# Patient Record
Sex: Female | Born: 1950 | Race: White
Health system: Midwestern US, Academic
[De-identification: ages and names within clinical notes are randomized; demographics above are authoritative.]

---

## 2015-12-27 ENCOUNTER — Encounter

## 2015-12-28 ENCOUNTER — Encounter: Attending: Gerontology

## 2016-04-17 ENCOUNTER — Ambulatory Visit: Admit: 2016-04-17 | Discharge: 2016-04-17 | Payer: PRIVATE HEALTH INSURANCE | Attending: Gerontology

## 2016-04-17 DIAGNOSIS — L219 Seborrheic dermatitis, unspecified: Secondary | ICD-10-CM

## 2016-04-17 MED ORDER — ketoconazole (NIZORAL) 2 % shampoo
2 | TOPICAL | 5 refills | 30.00000 days | Status: AC
Start: 2016-04-17 — End: 2017-04-17

## 2016-04-17 NOTE — Unmapped (Signed)
Seborrheic Dermatitis    What is seborrheic dermatitis?  Seborrheic dermatitis is a common, benign, scaling rash that commonly occurs on the scalp, eyebrows, sides of nose, and ear. Dandruff falls into this diagnosis. There is no cure for this condition, however treatments can help to keep symptoms and disease activity under control.    Treatments    Ketoconazole 2% shampoo apply to scalp and face  three times a week for 4 weeks and then once a week ongoing Leave on 5 to 10 minutes before rinsing.

## 2016-04-17 NOTE — Unmapped (Signed)
Chief Complaint: Scaly rash on cheeks  HPI: Pt is a 65 y.o. 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 No family history of MM. Here today for evaluation of the following:    1. Several month history of mildly pruritic eruption on bilateral medial cheeks and occipital scalp    2. Multiple brown lesions over trunk and extremities for several years, asymptomatic, not changing    3. Small red spots on trunk and extremities for several years, asymptomatic and unchanged      Derm History:   NPV  ( +history of NMSC or MM  ( - ) sunburns easily  ( + ) uses sunscreen  ( - ) history of tanning bed use  ( - ) family history of MM    Allergies   Allergen Reactions   ??? Peanut Oil Hives     Current Outpatient Prescriptions   Medication Sig   ??? acetaminophen Take by mouth.   ??? amlodipine-benazepril Take by mouth.   ??? aspirin-calcium carbonate Take by mouth.   ??? benazepril Take by mouth.   ??? calcium carbonate Chew by mouth.   ??? hydrOXYzine HCl TAKE 1 TABLET (ORAL) EVERY 6 HOURS AS NEEDED FOR ITCHING FOR 5 DAYS MAY CAUSE DROWSINESS   ??? levothyroxine TAKE 1 TABLET BY ORAL ROUTE EVERY DAY   ??? mometasone Use into each nostril.   ??? PARoxetine Take half tablet by mouth every evening for 10 days, then take 1 tablet by mouth every evening   ??? spironolactone Take by mouth.   ??? torsemide Take by mouth.   ??? triamcinolone APPLY 1 APPLICATION (TOPICAL) 2 TIMES PER DAY AS NEEDED   ??? ketoconazole Wash hair and affected areas on face with shampoo three times a week for 1 month then once a week thereafter. Lather and leave on for 5-10 minutes before rinsing.     No current facility-administered medications for this visit.      Active Ambulatory Problems     Diagnosis Date Noted   ??? No Active Ambulatory Problems     Resolved Ambulatory Problems     Diagnosis Date Noted   ??? No Resolved Ambulatory Problems     No Additional Past Medical History         ADDITIONAL HISTORY:  I have  reviewed past medical and surgical histories, current medications, and allergies as documented in the patient's electronic medical record    Review of Systems:   General: Negative for fevers, chills ,fatigue  Skin: No other cutaneous concerns  Allergies: Denies seasonal or environmental allergies, medication allergies reviewed    Physical Exam:  FSE  General: pleasant, in no acute distress  Skin: Examination was performed of the following: psych, scalp/hair, head/face, eyelids, lips, neck, breast/axilla/chest, abdomen, back, RUE, LUE, RLE, LLE, nails/digits and buttocks. Findings within normal limits apart from as documented below:      1.)  Bilateral medial cheeks with ill-defined scaly erythematous patches. Occipital scalp with erythematous ill-defined greasy scaly patches.    2.) Trunk and extremities with rough, verrucous stuck-on brown papules and plaques    3.) Trunk and extremities with scattered 1-4 mm bright red papules           A complete skin exam was completed on : 04/17/2016    A&P:  1.) Seborrheic Dermatitis: bilateral medial cheeks and occipital scalp  -Discussed etiology, natural course and treatment options  - reviewed  the benign but chronic nature of disease with patient   - start ketoconazole 2% shampoo three times a week for one month and then once a week ongoing, lather and leave in place for 5-10 minutes before washing off    2.) Seborrheic keratosis trunk and extremities  -educated and reassured patient regarding benign nature of lesion  -pamphlet provided to patient  - no treatment medically necessary    3.) Cherry angiomas trunk and extremities  -educated and reassured patient regarding benign nature of lesion  - no treatment medically necessary      ??  Return to clinic: one year for FSE  Discussed plan with patient and patient verbalized understanding of plan of care.  Patient to call clinic with questions or concerns  Reviewed side effects of treatment(s) and/or medication(s) with patient    AVS provided or is available on [facility] Mychart  ??

## 2016-09-26 NOTE — Unmapped (Signed)
Your patient was seen at a [facility]. Please go to [email or web address] to view information filed to your patient's chart in Epic.  If you need to view your patient's results prior to gaining access to   Epic CareLink, please contact the [facility] where your patient was seen.              Arlett, Goold #[id number] ([id number])  [phone] y.o. F)   PCP: Thamas Jaegers [phone])    OTF 1         ED Arrival Information     Expected Arrival Acuity Means of Arrival Escorted By Service Admission Type    - 09/26/2016 11:34 AM 3-Urgent Walk In - Emergency Medicine Emergency      Arrival Complaint    POSS PANIC ATTACKS        Chief Complaint     Complaint Comment    Shortness of Breath Pt reports feeling SOB and weird for the past 3 days. Pt reports nausea, denies CP. WOB is labored with exertion. Pt denies fevers or any recent travel        ED Vitals    Date/Time Temp Pulse Resp BP SpO2 Weight Who   09/26/16 1400 -- 78 16 115/67 99 % -- EJW   09/26/16 1330 -- 77 14 (!)  124/58 95 % -- EJW   09/26/16 1153 99.8 ????F (37.7 ????C) 88 26 106/89 95 % 250 lb (113.4 kg) JH        Allergies (verified on: 09/26/16)     Agent Severity Comments    Peanut Oil      Peanut-containing Drug Products          Medical History     Past Medical History Date Comments    Hypertension [I10]      Hyperlipidemia [E78.5]      Encephalitis [G04.90]      Diabetes mellitus (HCC) [E11.9]  borderline/on no meds    Thyroid disease [E07.9]  hypothyrodisim    CHF (congestive heart failure) (HCC) [I50.9]      Chronic back pain [M54.9, G89.29]      Chronic kidney disease [N18.9]      Hypothyroidism [E03.9]      Obesity [E66.9]          Surgical History     Past Surgical History     Laterality Last Occurrence Comments  Carpal tunnel release [SHX101]   bialteral/multiple times  Arm Surgery [SHX1003A]   to repair pinched nerves left arm  Colonoscopy [EXB284]  2014 normal - repeat in 10 yrs  Skin cancer    excision [XLK440]   Mole removed from upper back          Obstetric History     Gravida  4   Para  4   Living  4   Live Births  4          ED Provider Notes     Caesar Bookman, MD 09/26/2016  7:40 PM             [facility] EMERGENCY DEPARTMENT ENCOUNTER        EM RESIDENT NOTE       Date of evaluation: 09/26/2016    Chief Complaint     Shortness of Breath (Pt reports feeling SOB and weird for the past 3 days. Pt reports nausea, denies CP. WOB is labored with exertion. Pt denies fevers or any recent travel)      History of  Present Illness     JERALYNN VAQUERA is a 66 y.o. female with past medical history significant for CHF, diabetes, CK D, hyperlipidemia, hypertension who presents emergency department with episodes of lightheadedness.    Patient states for the last couple of weeks she's been having episodes where she feels lightheaded.  She denies any vertiginous symptoms.  She denies any True syncope.  She states she'll have episodes last 5-10 seconds where she feels lightheaded and   that she is breathing fast.    She denies any chest pain with the episodes patient denies any nausea, vomiting, diaphoresis.  She denies any cough with sputum production.  She denies any belly pain.  She denies any significant palpitations.  She has any headache, changes in vision,   numbness, tingling, weakness.    Patient states she's been diagnosed with panic and this in the past.  She states she was previously on medication for this which she stopped taking one month ago because it gave her joint pains and other side effects.  He also states she's been on   thyroid medications with no changes in the doses, however she has been taking it differently she says.    Review of Systems     Review of Systems   Constitutional: Negative for activity change, appetite change, chills, diaphoresis, fatigue and fever.   HENT: Negative for congestion, dental problem, drooling, ear discharge, ear pain and facial swelling.    Eyes:  Negative for photophobia, pain, discharge, redness and itching.   Respiratory: Positive for shortness of breath. Negative for cough, choking, chest tightness and stridor.    Cardiovascular: Negative for chest pain and leg swelling.   Gastrointestinal: Negative for abdominal distention, abdominal pain, anal bleeding, blood in stool and constipation.   Endocrine: Negative for cold intolerance and heat intolerance.   Genitourinary: Negative for difficulty urinating, dyspareunia, flank pain and frequency.   Musculoskeletal: Negative for back pain, gait problem, joint swelling, myalgias and neck pain.   Skin: Negative for pallor, rash and wound.   Neurological: Negative for dizziness, seizures, facial asymmetry, speech difficulty, light-headedness, numbness and headaches.   Hematological: Negative for adenopathy.   Psychiatric/Behavioral: Negative for agitation and behavioral problems.       Past Medical, Surgical, Family, and Social History     She has a past medical history of CHF (congestive heart failure) (HCC); Chronic back pain; Chronic kidney disease; Diabetes mellitus (HCC); Encephalitis; Hyperlipidemia; Hypertension; Hypothyroidism; Obesity; and Thyroid disease.  She has a past surgical history that includes Carpal tunnel release; Arm Surgery; Colonoscopy (2014); and Skin cancer excision.  Her family history includes Breast Cancer in her paternal grandmother.  She reports that she quit smoking about 10 years ago. She quit after 40.00 years of use. She has never used smokeless tobacco. She reports that she does not drink alcohol or use drugs.    Medications     Discharge Medication List as of 09/26/2016  3:31 PM      CONTINUE these medications which have NOT CHANGED     Details   torsemide (DEMADEX) 20 MG tablet TAKE 2 TABLETS BY MOUTH EVERY MORNING, Disp-90 tablet, R-1Normal       atorvastatin (LIPITOR) 40 MG tablet Take 1 tablet by mouth daily, Disp-30 tablet, R-3Normal       albuterol sulfate HFA 108 (90 Base)  MCG/ACT inhaler Inhale 2 puffs into the lungs every 6 hours as needed for Wheezing, Disp-1 Inhaler, R-5Normal       levothyroxine (  SYNTHROID) 125 MCG tablet TAKE 1 TABLET BY ORAL ROUTE EVERY DAY, Disp-90 tablet, R-1Normal       baclofen (LIORESAL) 10 MG tablet Take 1 tablet by mouth 3 times daily May cause drowsiness., Disp-90 tablet, R-1Normal       benazepril (LOTENSIN) 20 MG tablet Take 1 tablet by mouth daily, Disp-90 tablet, R-1Normal       spironolactone (ALDACTONE) 100 MG tablet Take 0.5 tablets by mouth every morning, Disp-90 tablet, R-1Normal       !! LORazepam (ATIVAN) 2 MG tablet Take 1 tablet by mouth nightly as needed for Anxiety (sleep) for up to 90 days May cause drowsiness.., Disp-30 tablet, R-2Normal       vitamin D (D3-1000) 1000 units TABS tablet Take 1 tablet by mouth daily, Disp-90 tablet, R-3Normal       calcium carbonate (TUMS) 500 MG chewable tablet Take 1 tablet by mouth Daily with supper, Disp-90 tablet, R-1Normal       hydrOXYzine (ATARAX) 25 MG tablet TAKE 1 TABLET (ORAL) EVERY 6 HOURS AS NEEDED FOR ITCHING FOR 5 DAYS MAY CAUSE DROWSINESS, R-0Historical Med       triamcinolone (KENALOG) 0.1 % ointment APPLY 1 APPLICATION (TOPICAL) 2 TIMES PER DAY AS NEEDED, R-0, Historical Med       aspirin 81 MG tablet Take 1 tablet by mouth daily, Disp-90 tablet, R-1Normal       mometasone (NASONEX) 50 MCG/ACT nasal spray 2 sprays by Nasal route daily, Nasal, DAILY Starting 11/14/2015, Until Discontinued, Disp-2 Inhaler, R-5, Normal       polyethylene glycol (MIRALAX) POWD powder Take 17 g by mouth daily, Disp-1 Bottle, R-3Normal       acetaminophen (TYLENOL) 500 MG tablet Take 2 tablets by mouth every 6 hours as needed for Pain, Disp-225 tablet, R-3Normal        !! - Potential duplicate medications found. Please discuss with provider.            Allergies     She is allergic to peanut oil and peanut-containing drug products.    Physical Exam     INITIAL VITALS: BP: 106/89, Temp: 99.8 ????F (37.7 ????C),  Pulse: 88, Resp: 26, SpO2: 95 %   Physical Exam   Constitutional: She is oriented to person, place, and time. She appears well-developed and well-nourished. No distress.   HENT:   Head: Normocephalic and atraumatic.   Right Ear: External ear normal.   Mouth/Throat: No oropharyngeal exudate.   Eyes: EOM are normal. Pupils are equal, round, and reactive to light. Right eye exhibits no discharge. Left eye exhibits no discharge.   Neck: Normal range of motion. No tracheal deviation present. No thyromegaly present.   Cardiovascular: Normal rate, regular rhythm and normal heart sounds.  Exam reveals no gallop and no friction rub.    No murmur heard.  Pulmonary/Chest: Effort normal. No respiratory distress. She has no wheezes. She has no rales.   Abdominal: Soft. She exhibits no distension. There is no tenderness. There is no rebound and no guarding.   Musculoskeletal: Normal range of motion. She exhibits no edema, tenderness or deformity.   Neurological: She is alert and oriented to person, place, and time. She has normal strength and normal reflexes. She displays no atrophy and no tremor. No cranial nerve deficit or sensory deficit. She exhibits normal muscle tone. She displays a negative   Romberg sign. She displays no seizure activity. Coordination and gait normal. GCS eye subscore is 4. GCS verbal subscore is 5. GCS  motor subscore is 6.   Skin: She is not diaphoretic.       Diagnostic Results     EKG   Interpreted in conjunction with emergency department physician No att. providers found  Rhythm: normal sinus   Rate: normal  Axis: normal  Ectopy: none  Conduction: normal  ST Segments: normal  T Waves: normal  Q Waves: none  Clinical Impression: no acute changes  Comparison: 12/27/15  RADIOLOGY:  XR CHEST STANDARD (2 VW)   Final Result      No consolidation          LABS:   Results for orders placed or performed during the hospital encounter of 09/26/16       CBC auto differential       Result   Value Ref  Range    WBC  12.8 (H) 4.0 - 11.0 K/uL    RBC  4.41 4.00 - 5.20 M/uL    Hemoglobin  13.4 12.0 - 16.0 g/dL    Hematocrit  16.1 09.6 - 48.0 %    MCV  90.9 80.0 - 100.0 fL    MCH  30.4 26.0 - 34.0 pg    MCHC  33.4 31.0 - 36.0 g/dL    RDW  04.5 40.9 - 81.1 %    Platelets  322 135 - 450 K/uL    MPV  9.3 5.0 - 10.5 fL    Neutrophils %  77.3 %    Lymphocytes %  16.8 %    Monocytes %  5.1 %    Eosinophils %  0.1 %    Basophils %  0.7 %    Neutrophils #  9.8 (H) 1.7 - 7.7 K/uL    Lymphocytes #  2.1 1.0 - 5.1 K/uL    Monocytes #  0.7 0.0 - 1.3 K/uL    Eosinophils #  0.0 0.0 - 0.6 K/uL    Basophils #  0.1 0.0 - 0.2 K/uL   Basic Metabolic Panel       Result   Value Ref Range    Sodium  131 (L) 136 - 145 mmol/L    Potassium  4.3 3.5 - 5.1 mmol/L    Chloride  86 (L) 99 - 110 mmol/L    CO2  25 21 - 32 mmol/L    Anion Gap  20 (H) 3 - 16    Glucose  155 (H) 70 - 99 mg/dL    BUN  24 (H) 7 - 20 mg/dL    CREATININE  1.2 0.6 - 1.2 mg/dL    GFR Non-African American  45 (A) >60    GFR African American  55 (A) >60    Calcium  10.1 8.3 - 10.6 mg/dL   Brain Natriuretic Peptide       Result   Value Ref Range    Pro-BNP  248 (H) 0 - 124 pg/mL   Troponin (lab)       Result   Value Ref Range    Troponin  <0.01 <0.01 ng/mL   POCT Venous       Result   Value Ref Range    pH, Ven  7.481 (H) 7.350 - 7.450    pCO2, Ven  39.1 (L) 40.0 - 50.0 mm Hg    pO2, Ven  30 Not Established mm Hg    HCO3, Venous  29.2 (H) 23.0 - 29.0 mmol/L    Base Excess, Ven  6 (H) -3 - 3    O2  Sat, Ven  62 Not Established %    TC02 (Calc), Ven  30 Not Established mmol/L    Lactate  1.55 0.40 - 2.00 mmol/L    Sample Type  VEN     Performed on  SEE BELOW    POC URINE with Microscopic       Result   Value Ref Range    Color, UA  Not Entered Straw/Yellow    Clarity, UA  Not Entered Clear    Glucose, Ur  Negative Negative mg/dL    Bilirubin Urine  Negative Negative mg/dL    Ketones, Urine  Negative Negative mg/dL    Specific Gravity, UA  <=1.005 1.005 - 1.030    Blood,  Urine  Negative Negative    pH, UA  5.5 5.0 - 8.0    Protein, UA  Negative Negative mg/dL    Urobilinogen, Urine  0.2 <2.0 E.U./dL    Nitrite, Urine  Negative Negative    Leukocyte Esterase, Urine  Negative Negative    Microscopic Examination  SEE BELOW    EKG 12 Lead       Result   Value Ref Range    Ventricular Rate  90 BPM    Atrial Rate  90 BPM    P-R Interval  190 ms    QRS Duration  92 ms    Q-T Interval  342 ms    QTc Calculation (Bazett)  418 ms    P Axis  74 degrees    R Axis  82 degrees    T Axis  65 degrees    Diagnosis        EKG performed in ER and to be interpreted by ER physician. Confirmed by MD, ER ([id number]), editor Nogal, TONI [phone]) on 09/26/2016 3:04:46 PM          ED BEDSIDE ULTRASOUND:      RECENT VITALS:  BP: 115/67, Temp: 99.8 ????F (37.7 ????C), Pulse: 78, Resp: 16, SpO2: 99 %     Procedures         ED Course     Nursing Notes, Past Medical Hx, Past Surgical Hx, Social Hx, Allergies, and Family Hx were reviewed.    The patient was given the following medications:  Orders Placed This Encounter     Medications     ??????? 0.9 % sodium chloride bolus    ??????? LORazepam (ATIVAN) tablet 1 mg    ??????? LORazepam (ATIVAN) 1 MG tablet      Sig: Take 0.5 tablets by mouth every 6 hours as needed for Anxiety for up to 4 doses..     Dispense:  2 tablet     Refill:  0       CONSULTS:  None    MEDICAL DECISION MAKING / ASSESSMENT / PLAN     MARLI DIEGO is a 66 y.o. female who presented emergency department with episodes of lightheadedness.    Neurological exam is intact.  No focal neurological deficits appreciated.  Symptoms are inconsistent with TIA.  No concern for stroke based on the patient's symptoms and history.    No concern for seizure.  The patient never lost consciousness and can remember every episode.    Cardiac etiologies were considered.  EKG is without signs of arrhythmia.  No signs of ischemia.  Laboratory evaluations are negative for any signs of myocardial injury.  We have little concern for cardiac  etiology of this time.    Metabolic etiologies were also considered.  She has a mildly elevated blood sugar with no signs of hypoglycemia.  It is unlikely she has some medical hyperglycemia given her blood sugar of 190 and no significant evidence of dehydration or increased   urinary frequency.    No infectious symptoms.  Urinalysis is clear and chest x-rays clear.    Patient's symptoms are consistent with her anxiety.  Patient is given a dose of Ativan here with some symptomatic improvement.  She was given a short course of Ativan going home and encouraged to follow up with her PCP to readdress her anxiety   medication.  Patient is given strict return precautions.    This patient was also evaluated by the attending physician. All care plans were discussed and agreed upon.    Clinical Impression     1. Light-headedness        Disposition     PATIENT REFERRED TO:  Glynis Smiles, MD  [address]  [place] [address]  [phone]    In 3 days        DISCHARGE MEDICATIONS:  Discharge Medication List as of 09/26/2016  3:31 PM      START taking these medications     Details   !! LORazepam (ATIVAN) 1 MG tablet Take 0.5 tablets by mouth every 6 hours as needed for Anxiety for up to 4 doses.., Disp-2 tablet, R-0Print        !! - Potential duplicate medications found. Please discuss with provider.            DISPOSITION          Caesar Bookman, MD  Resident  09/26/16 1940         Cosigned by Durward Fortes, MD at 09/26/2016  8:04 PM          ED Medication Orders     Start Ordered     Status Ordering Provider    09/26/16 1523 09/26/16 1522  LORazepam (ATIVAN) tablet 1 mg  ONCE     Last MAR action:  Given - by Donell Beers on 09/26/16 at 1539 LUDMER, NICHOLAS G    09/26/16 1340 09/26/16 1339  0.9 % sodium chloride bolus  ONCE     Last MAR action:  Stopped - by Donell Beers on 09/26/16 at 1531 LUDMER, NICHOLAS G        Lab Results          POC URINE with Microscopic (Final result)  Result time  09/26/16 15:21:33    Collection Time Result Time COLOR U CLARITY U GLUCOSE U BILI UR KETONES U SPEC GRAV BLOOD U PH, UR Protein, UA Urobilinogen, Urine   09/26/16 15:10:00 09/26/16 15:21:00 Not   Entered Not Entered Performed on POC Negative Negative Negative <=1.005 Negative 5.5 Negative 0.2     Previous Results   12/27/15 14:13:00 12/27/15 14:14:00 Not Entered Not Entered Performed on POC Negative Negative Negative 1.010 Negative 5.5 Negative   0.2       Collection Time Result Time NITRITE LEUKOCYTES, UR Microscopic Examination   09/26/16 15:10:00 09/26/16 15:21:00 Negative Negative SEE BELOW Microscopic - Not Indicated     Previous Results   12/27/15 14:13:00 12/27/15 14:14:00 Negative   Negative SEE BELOW Microscopic - Not Indicated           Final result            Narrative:   Performed at: [facility] [address],  [place], [place] [address]   Phone [phone]                       POCT Venous (Final result)     Abnormal  Result time 09/26/16 14:07:02    Collection Time Result Time PH VEN PCO2 VEN PO2 VEN HCO3, Venous BE VEN O2 SAT VEN TC02 (Calc), Ven LACTATE Sample Type Performed on   09/26/16 14:03:00 09/26/16 14:06:00 (!)   7.481 (H) (!) 39.1 (L) 30 (!) 29.2 (H) (!) 6 (H) 62 30 1.55 VEN SEE BELOW Performed on POC     Previous Results   12/27/15 14:13:00 12/27/15 14:19:00         VEN SEE BELOW Performed on POC   12/27/15 14:12:00 12/27/15 14:16:00 (!) 7.449 (H) (!) 51.3 (H)   30 (!) 35.6 (H) (!) 12 (H) 59 37 (!) 2.02 (H) VEN SEE BELOW Performed on POC   01/31/15 07:33:00 01/31/15 07:50:00          ACCU-CHEK   01/31/15 00:04:00 01/31/15 00:06:00          ACCU-CHEK   01/30/15 13:18:00 01/30/15 13:22:00         VEN SEE BELOW   Performed on POC           Final result            Narrative:   Performed at: [facility] [address],  [place], [place] [address]   Phone [phone]                       CBC auto differential  (Final result)     Abnormal  Result time 09/26/16 12:56:17    Collection Time Result Time WBC RBC HGB HCT MCV MCH MCHC RDW PLT MPV   09/26/16 12:43:00 09/26/16 12:56:00 (!) 12.8 (H) 4.41 13.4 40.1 90.9 30.4 33.4 12.9 322 9.3       Previous Results   12/28/15 05:08:00 12/28/15 05:46:00 8.7 4.03 12.2 (!) 35.9 (L) 89.0 30.4 34.1 13.5 247 8.8   12/27/15 14:11:00 12/27/15 14:16:00 10.9 4.56 13.9 40.4 88.7 30.4 34.3 13.6 281 9.6   11/14/15 14:01:00 11/14/15 23:35:00 9.0 4.67 14.1 42.9   92.0 30.3 32.9 15.4 302 9.9   01/30/15 13:11:00 01/30/15 13:25:00 (!) 17.3 (H) 5.01 14.8 44.0 87.9 29.5 33.5 14.6 300 9.2   11/07/14 18:00:00 11/07/14 18:14:00 10.0 4.91 14.4 43.6 88.7 29.3 33.0 14.4 304 8.9       Collection Time Result Time NEUTRO PCT   LYMPHO PCT MONO PCT EOS BASOS PCT NEUTRO ABS LYMPHS ABS MONOS ABS EOS ABS BASOS ABS   09/26/16 12:43:00 09/26/16 12:56:00 77.3 16.8 5.1 0.1 0.7 (!) 9.8 (H) 2.1 0.7 0.0 0.1     Previous Results   12/28/15 05:08:00 12/28/15 05:46:00 59.1 25.4 10.9 4.0 0.6   5.2 2.2 1.0 0.3 0.1   12/27/15 14:11:00 12/27/15 14:16:00 72.5 16.0 8.5 2.1 0.9 (!) 7.9 (H) 1.7 0.9 0.2 0.1   11/14/15 14:01:00 11/14/15 23:35:00 60.4 27.6 8.2 3.1 0.7 5.4 2.5 0.7 0.3 0.1   01/30/15 13:11:00 01/30/15 13:25:00 77.1 13.0 8.2 1.3 0.4 (!)   13.4 (H) 2.2 (!) 1.4 (H) 0.2 0.1   11/07/14 18:00:00 11/07/14 18:14:00 68.2 21.4 7.3 2.4 0.7 6.8 2.2 0.7 0.2 0.1           Final result            Narrative:  Performed at: [facility] [address]    [place], [place] [address]   Phone [phone]                       TSH without Reflex (In process)  Result time 09/26/16 12:48:48             Basic Metabolic Panel (Final result)     Abnormal  Result time 09/26/16 13:17:30    Collection Time Result Time NA K CL CO2 ANION GAP GLUCOSE BUN CREATININE GFR Non-African American GFR African American   09/26/16 12:42:00 09/26/16 13:17:00 (!) 131 (L)   4.3 (!) 86 (L) 25 (!) 20 (H) (!) 155 (H) (!) 24 (H)  1.2 (!) 45 (A) >60 mL/min/1.54m2 EGFR, calc. for ages 35 and older using the MDRD formula (not corrected for weight), is valid for stable renal function.  (!) 55 (A) Chronic Kidney Disease: less than 60   ml/min/1.73 sq.m.         Kidney Failure: less than 15 ml/min/1.73 sq.m. Results valid for patients 18 years and older.      Previous Results   07/23/16 20:00:00 07/23/16 20:53:00 142 5.1 (!) 96 (L) 31 15 (!) 119 (H) (!) 26 (H) 1.0 (!) 56 (A) >60   mL/min/1.38m2 EGFR, calc. for ages 31 and older using the MDRD formula (not corrected for weight), is valid for stable renal function.  >60 Chronic Kidney Disease: less than 60 ml/min/1.73 sq.m.         Kidney Failure: less than 15 ml/min/1.73 sq.m.   Results valid for patients 18 years and older.    04/10/16 11:23:00 04/10/16 23:14:00 145 4.2 (!) 93 (L) (!) 34 (H) (!) 18 (H) (!) 139 (H) 18 0.8 >60 >60 mL/min/1.32m2 EGFR, calc. for ages 16 and older using the MDRD formula (not corrected for weight),   is valid for stable renal function.  >60 Chronic Kidney Disease: less than 60 ml/min/1.73 sq.m.         Kidney Failure: less than 15 ml/min/1.73 sq.m. Results valid for patients 18 years and older.    02/14/16 08:28:00 02/14/16 16:13:00 144 4.8 (!) 98   (L) 32 14 (!) 138 (H) 16 0.7 >60 >60 mL/min/1.34m2 EGFR, calc. for ages 55 and older using the MDRD formula (not corrected for weight), is valid for stable renal function.  >60 Chronic Kidney Disease: less than 60 ml/min/1.73 sq.m.         Kidney   Failure: less than 15 ml/min/1.73 sq.m. Results valid for patients 18 years and older.    12/28/15 05:08:00 12/28/15 05:55:00 136 4.4 (!) 97 (L) 25 14 (!) 128 (H) (!) 30 (H) 1.1 (!) 50 (A) >60 mL/min/1.79m2 EGFR, calc. for ages 29 and older using the   MDRD formula (not corrected for weight), is valid for stable renal function.  >60 Chronic Kidney Disease: less than 60 ml/min/1.73 sq.m.         Kidney Failure: less than 15 ml/min/1.73 sq.m. Results valid for patients 18 years and  older.    12/27/15   14:13:00 12/27/15 14:19:00    30     (!) 41 (A) >60 mL/min/1.68m2 EGFR, calc. for ages 48 and older using the MDRD formula (not corrected for weight), is valid for stable renal function.  (!) 50 (A) >60 mL/min/1.24m2 EGFR, calc. for ages 47 and older   using the MDRD formula (not corrected for weight), is valid for stable  renal function.        Collection Time Result Time CALCIUM PHOS CA ION Sample Type Performed on Alb POC Sodium POC Potassium POC Chloride POC Anion Gap   09/26/16 12:42:00 09/26/16   13:17:00 10.1              Previous Results   07/23/16 20:00:00 07/23/16 20:53:00 10.0 4.4    4.6       04/10/16 11:23:00 04/10/16 23:14:00 10.3            02/14/16 08:28:00 02/14/16 16:13:00 9.8            12/28/15 05:08:00 12/28/15 05:55:00 9.2              12/27/15 14:13:00 12/27/15 14:19:00   1.12 Effective 01/12/2013 10:08am Please note: the unit of measure for this analyte is now mmol/L.  Please note the change in reference ranges.  Decreased pH due to localized lactic acid production may cause   increased ionized calcium.  Increased pH due to loss of CO2 from the sample may cause decreased ionized calcium.  VEN SEE BELOW Performed on POC  (!) 135 (L) (!) 5.3 (H) (!) 95 (L) 10       Collection Time Result Time POC Glucose POC BUN POC Creatinine     09/26/16 12:42:00 09/26/16 13:17:00        Previous Results   07/23/16 20:00:00 07/23/16 20:53:00      04/10/16 11:23:00 04/10/16 23:14:00      02/14/16 08:28:00 02/14/16 16:13:00      12/28/15 05:08:00 12/28/15 05:55:00      12/27/15 14:13:00 12/27/15   14:19:00 (!) 121 (H) (!) 43 (H) (!) 1.3 (H)           Final result            Narrative:   Performed at: [facility] [address]  [place], [place] [address]   Phone [phone]                       Brain Natriuretic Peptide (Final result)     Abnormal  Result time 09/26/16 13:17:30    Collection Time Result Time Pro-BNP   09/26/16 12:42:00 09/26/16  13:17:00 (!) 248 (H) Methodology by NT-proBNP  An age-independent cutoff point of 300 pg/ml has a   98% negative predictive value excluding acute heart failure. Values exceeding the age-related cutoff values (450 pg/mL if age<50, 900 if 50-75 and 1800 if >75) has 90% sensitivity and 84% specificity for diagnosing acute HF. In patients with renal   compromise (eGFR<60) values greater than 1200pg/ml have a diagnostic sensitivity and specificity of 89% and 72% for acute HF.      Previous Results   12/27/15 14:11:00 12/27/15 16:25:00 77 Methodology by NT-proBNP  An age-independent cutoff point of 300   pg/ml has a 98% negative predictive value excluding acute heart failure. Values exceeding the age-related cutoff values (450 pg/mL if age<50, 900 if 50-75 and 1800 if >75) has 90% sensitivity and 84% specificity for diagnosing acute HF. In patients with   renal compromise (eGFR<60) values greater than 1200pg/ml have a diagnostic sensitivity and specificity of 89% and 72% for acute HF.            Final result            Narrative:   Performed at: [facility] [address],  [place],  [place] [address]   Phone [phone]                       Troponin (lab) (Final result)  Result time 09/26/16 13:17:30    Collection Time Result Time Troponin   09/26/16 12:42:00 09/26/16 13:17:00 <0.01 Methodology by Troponin T     Previous Results   01/31/15 00:04:00 01/31/15 00:52:00 <0.01 Methodology by   Troponin T   01/30/15 18:46:00 01/30/15 19:27:00 <0.01 Methodology by Troponin T   09/15/12 11:45:00 09/15/12 12:18:00 <0.01 Methodology by Troponin T   09/15/12 02:30:00 09/15/12 03:00:00 <0.01 Methodology by Troponin T           Final result              Narrative:   Performed at: [facility] [address]  [place], [place] [address]   Phone [phone]                    Imaging Results          XR CHEST STANDARD (2 VW) (Final result)  Result time  09/26/16 12:42:18   Final result by Candis Shine, MD (09/26/16 12:42:18)            Impression:    No consolidation         Narrative:   Chest PA and lateral  HISTORY: Shortness of breath.    COMPARISON: December 27, 2015 chest.  The heart size is normal  No focal consolidation or edema  8 mm granuloma right lower lobe.  Osteopenia thoracic spine.                     ED Administered Medications from 09/26/2016 1134 to 09/26/2016 2004       Date/Time Order Dose Route Action Action by Comments     09/26/2016 1401 0.9 % sodium chloride bolus 500 mL Intravenous New Bag Donell Beers, [place]      09/26/2016 1531 0.9 % sodium chloride bolus 0 mL Intravenous Stopped Donell Beers, RN      09/26/2016 1539 LORazepam (ATIVAN) tablet 1 mg 1 mg Oral Given Donell Beers, RN         ED Treatment Team     Provider Role From To Phone Pager    Durward Fortes, MD Attending Provider 09/26/16 [phone] -- [phone]         Code Onset/Outcome    None     Code Interventions/Drips/Airways    None     Diagnosis     Diagnosis Comment    Light-headedness         ED Prescriptions     Medication Sig Dispense Start Date End Date Auth. Provider    LORazepam (ATIVAN) 1 MG tablet Take 0.5 tablets by mouth every 6 hours as needed for Anxiety for up to 4 doses.. 2 tablet 09/26/2016 10/01/2016 Caesar Bookman, MD        Follow-up Information     Follow up With Specialties Details Why Contact Info    Glynis Smiles, MD Internal Medicine In 3 days  [address] [place] [address] [phone]           Discharge Instructions       You were seen in the emergency department for episodes of lightheadedness, dizziness, feeling funny.  Your laboratory evaluations all look okay.  Her EKG looks alright as well.  There is no sign of infection on your lab work or urine studies.  We did   draw some thyroid studies and we are waiting for the results of those, and they may come back in the next day or so.    May be related to anxiety/panic disorder.   We'll give you a short course of Ativan going home that she may take as needed every 12 hours.  It looks like you already prescribed Ativan at one point to take at night.  If you're taking her Ativan at night,   please don't take the 0.5 mg dose close to that time.    We encouraged follow-up with your doctor early next week to make sure symptoms are improving and to discuss possible different medications for your anxiety or panic.  Please return to the emergency department if any chest pain, shortness of breath,   difficult to breathing.

## 2016-11-26 LAB — BASIC METABOLIC PANEL REFLEX MG
Anion Gap: 12 (ref 3–16)
BUN: 37 mg/dL (ref 7–20)
CO2: 32 mmol/L (ref 21–32)
Calcium: 10.3 mg/dL (ref 8.3–10.6)
Chloride: 95 mmol/L (ref 99–110)
Creatinine: 1.3 mg/dL (ref 0.6–1.2)
GFR MDRD Af Amer: 50 (ref >60)
GFR MDRD Non Af Amer: 41 (ref >60)
Glucose: 144 mg/dL (ref 70–99)
Potassium: 4.1 mmol/L (ref 3.5–5.1)
Sodium: 139 mmol/L (ref 136–145)

## 2016-11-26 LAB — POCT VENOUS
POC BNP: 41 pg/mL (ref 0.0–99.9)
POC Base Excess, Venous: 9 (ref <=3)
POC HCO3, Venous: 34.2 mmol/L (ref 23.0–29.0)
POC Lactate: 1.44 mmol/L (ref 0.40–2.00)
POC O2 Saturation, Venous: 23 %
POC TCO2, Venous: 36 mmol/L
POC Troponin I: 0 ng/mL (ref 0.00–0.10)
POC pCO2, Venous: 54.5 (ref 40.0–50.0)
POC pH, Venous: 7.405 (ref 7.350–7.450)
POC pO2, Venous: 17

## 2016-11-26 LAB — CBC AND DIFFERENTIAL
Basophils Absolute: 0.1 10*3/uL (ref 0.0–0.2)
Basophils Relative: 0.5 %
Eosinophils Absolute: 0.2 10*3/uL (ref 0.0–0.6)
Eosinophils Relative: 1.3 %
Hematocrit: 38.9 % (ref 36.0–48.0)
Hemoglobin: 13.1 g/dL (ref 12.0–16.0)
Lymphocytes Absolute: 2.7 10*3/uL (ref 1.0–5.1)
Lymphocytes Relative: 22.1 %
MCH: 30.4 pg (ref 26.0–34.0)
MCHC: 33.7 g/dL (ref 31.0–36.0)
MCV: 90.3 fL (ref 80.0–100.0)
MPV: 8.6 fL (ref 5.0–10.5)
Monocytes Absolute: 1.1 10*3/uL (ref 0.0–1.3)
Monocytes Relative: 8.5 %
Neutrophils Absolute: 8.4 10*3/uL (ref 1.7–7.7)
Neutrophils Relative: 67.6 %
Platelets: 295 10*3/uL (ref 135–450)
RBC: 4.31 M/uL (ref 4.00–5.20)
RDW: 14.1 % (ref 12.4–15.4)
WBC: 12.4 10*3/uL (ref 4.0–11.0)

## 2016-11-26 NOTE — Unmapped (Signed)
Your patient was seen at a [facility]. Please go to [email or web address] to view information filed to your patient's chart in Epic.  If you need to view your patient's results prior to gaining access to   Epic CareLink, please contact the [facility] where your patient was seen.              Cassidy Mcmillan, Cassidy Mcmillan #[id number] ([id number])  [phone] y.o. F)   PCP: Nicola Police [phone]         ED Arrival Information     Expected Arrival Acuity Means of Arrival Escorted By Service Admission Type    - 11/26/2016 10:56 AM 4-Less Urgent [facility] Other Emergency Medicine Emergency      Arrival Complaint    neck pain        Chief Complaint     Complaint Comment    Neck Pain     Dizziness         ED Vitals    Date/Time Temp Pulse Resp BP SpO2 Weight Who   11/26/16 1300 -- 74 -- 114/62 98 % -- JR   11/26/16 1150 -- -- -- -- 97 % -- JR   11/26/16 1100 97.9 ????F (36.6 ????C) 90 18 (!)  158/95 99 % 250 lb (113.4 kg) SP        Allergies (verified on: 11/26/16)     Agent Severity Comments    Peanut Oil      Peanut-containing Drug Products          Medical History     Past Medical History      Date Comments   Hypertension [I10]     Hyperlipidemia [E78.5]     Encephalitis [G04.90]     Diabetes mellitus (HCC) [E11.9]  borderline/on no meds   Thyroid disease [E07.9]  hypothyrodisim   CHF (congestive heart failure) (HCC)   [I50.9]     Chronic back pain [M54.9, G89.29]     Chronic kidney disease [N18.9]     Hypothyroidism [E03.9]     Obesity [E66.9]            Surgical History     Past Surgical History     Laterality Last Occurrence Comments  Carpal tunnel release [SHX101]   bialteral/multiple times  Arm Surgery [SHX1003A]   to repair pinched nerves left arm  Colonoscopy [YQM578]  2014 normal - repeat in 10 yrs  Skin cancer   excision [ION629]   Mole removed from upper back          Obstetric History     Gravida  4   Para  4   Living  4   Live Births  4          ED  Provider Notes     Delos Haring, MD 11/26/2016  9:37 PM             [facility] EMERGENCY DEPARTMENT ENCOUNTER        ATTENDING PHYSICIAN NOTE       Date of evaluation: 11/26/2016    Chief Complaint     Neck Pain and Dizziness      History of Present Illness     Cassidy Mcmillan is a 66 y.o. female who presents with 2 complaints today.  Firstly, the patient presents of several days of gradually worsening left-sided neck pain, which she states is worse when she turns her head in certain directions.  She does not recall  when it began, whether it was present upon awakening, whether some   strain may have caused it, but does feel that it has been gradually worsening.  She currently denies any pain, but states that it is sometimes worse when she moves certain ways.  She states that she has a history of having had similar pain in the same   place some years ago.  She was told that she had degenerative disc disease, saw a chiropractor, was given some stretching exercises, and it eventually resolved.  This feels very similar.  She denies any numbness or weakness of her extremities.  She   denies any midline neck pain.  She denies any antecedent trauma.  Secondly, the patient describes that, shortly after awakening this morning, while she was getting up and doing her usual morning routine, she twice stood up and felt lightheaded, had to stop and lean on a chair, because she felt as though she might pass   out.  After the second time that this happened, her husband called 911.  It is primarily for these episodes of lightheadedness that the patient presents to the emergency department today.  She denies any associated shortness of breath or chest pain.  She   states that her symptoms resolved after she was Sitting down for a little while.  She denies any associated palpitations.  She states that she has had episodes of lightheadedness before, but states that this feels different.  She denies any recent URI   symptoms,  cough, fevers or chills.  She denies any recent abdominal pain or nausea or vomiting.  She states that she feels that she drinks plenty of water, but that she is on diuretics, and wonders if this may be the problem.    Review of Systems     Review of Systems   Constitutional: Negative for chills and fever.   HENT: Negative.    Eyes: Negative.    Respiratory: Negative.  Negative for cough and shortness of breath.    Cardiovascular: Negative.  Negative for chest pain and palpitations.   Gastrointestinal: Negative.  Negative for abdominal pain, nausea and vomiting.   Endocrine: Negative.    Genitourinary: Negative.    Musculoskeletal: Positive for neck pain.   Skin: Negative.    Allergic/Immunologic: Negative.    Neurological: Positive for light-headedness. Negative for dizziness, weakness, numbness and headaches.   Hematological: Negative.    Psychiatric/Behavioral: Negative.        Past Medical, Surgical, Family, and Social History     She has a past medical history of CHF (congestive heart failure) (HCC); Chronic back pain; Chronic kidney disease; Diabetes mellitus (HCC); Encephalitis; Hyperlipidemia; Hypertension; Hypothyroidism; Obesity; and Thyroid disease.  She has a past surgical history that includes Carpal tunnel release; Arm Surgery; Colonoscopy (2014); and Skin cancer excision.  Her family history includes Breast Cancer in her paternal grandmother.  She reports that she quit smoking about 10 years ago. She quit after 40.00 years of use. She has never used smokeless tobacco. She reports that she does not drink alcohol or use drugs.    Medications     Previous Medications      ACETAMINOPHEN (TYLENOL) 500 MG TABLET    Take 2 tablets by mouth every 6 hours as needed for Pain    ALBUTEROL SULFATE HFA 108 (90 BASE) MCG/ACT INHALER    Inhale 2 puffs into the lungs every 6 hours as needed for Wheezing    ASPIRIN 81 MG TABLET  Take 1 tablet by mouth daily    ATORVASTATIN (LIPITOR) 40 MG TABLET    Take 1 tablet by  mouth daily    BENAZEPRIL (LOTENSIN) 20 MG TABLET    Take 1 tablet by mouth daily    CALCIUM CARBONATE (TUMS) 500 MG CHEWABLE TABLET    Take 1 tablet by mouth Daily with supper    HYDROXYZINE (ATARAX) 25 MG TABLET    TAKE 1 TABLET (ORAL) EVERY 6 HOURS AS NEEDED FOR ITCHING FOR 5 DAYS MAY CAUSE DROWSINESS    LEVOTHYROXINE (SYNTHROID) 125 MCG TABLET    TAKE 1 TABLET BY ORAL ROUTE EVERY DAY    MOMETASONE (NASONEX) 50 MCG/ACT NASAL SPRAY    2 sprays by Nasal route daily    POLYETHYLENE GLYCOL (MIRALAX) POWD POWDER    Take 17 g by mouth daily    PREDNISONE (DELTASONE) 20 MG TABLET    Take 3 tablets by mouth daily    SPIRONOLACTONE (ALDACTONE) 100 MG TABLET    Take 0.5 tablets by mouth every morning    TORSEMIDE (DEMADEX) 20 MG TABLET    TAKE 2 TABLETS BY MOUTH EVERY MORNING    TRIAMCINOLONE (KENALOG) 0.1 % OINTMENT    APPLY 1 APPLICATION (TOPICAL) 2 TIMES PER DAY AS NEEDED    VITAMIN D (D3-1000) 1000 UNITS TABS TABLET    Take 1 tablet by mouth daily       Allergies     She is allergic to peanut oil and peanut-containing drug products.    Physical Exam     INITIAL VITALS: BP: (!) 158/95, Temp: 97.9 ????F (36.6 ????C), Pulse: 90, Resp: 18, SpO2: 99 %     General: Moderately obese.  Overall well appearing.  Pleasant and conversational, and in NAD.    HEENT: Pupils are equal, round, and reactive to light. Extraocular muscles are intact.  Conjunctivae are clear and moist. No redness or drainage from the eyes. No drainage from the nose. The oropharynx appeared to be normal.    Neck: Supple, with full range of motion. No midline C-spine tenderness to palpation, crepitus, or step-offs.  There is mild tenderness to palpation in the left cervical paraspinous muscles into the left trapezius musculature.    Cardiovascular: Normal S1-S2 without murmur rub or gallop. 2+ radial pulses bilaterally. 2+ DP pulses bilaterally.    Respiratory: Unlabored breathing with equal chest rise and fall. Lungs are clear to auscultation bilaterally. No  adventitious lung sounds heard.    Abdomen: Soft and nontender, without guarding or rebound tenderness. No masses or hepatosplenomegaly.    Skin: Warm and dry, without rashes or ecchymoses, lacerations or abrasions.     Neuro: Alert and oriented x3. No focal neurologic deficits are noted.    Extremities: Warm and well-perfused, without clubbing, cyanosis, or edema. The patient moves all extremities equally.    Psych: The patient's mood and affect are generally within normal limits for their presentation.      Diagnostic Results     EKG   Normal sinus rhythm with a ventricular rate of 79 bpm.  Normal axis.  Normal intervals, with PR interval 202 ms, QRS duration 88 ms, QTC 415 ms.  There is somewhat poor R-wave progression in the anterior precordial leads.  No other ST segment or T-wave   abnormalities are noted.  This is entirely unchanged from prior EKG dated Sep 26, 2016.    RADIOLOGY:  No orders to display       LABS:   Results for  orders placed or performed during the hospital encounter of 11/26/16      CBC auto differential      Result  Value Ref Range    WBC 12.4 (H) 4.0 - 11.0 K/uL    RBC 4.31 4.00 - 5.20 M/uL    Hemoglobin 13.1 12.0 - 16.0 g/dL    Hematocrit 16.1 09.6 - 48.0 %    MCV 90.3 80.0 - 100.0 fL    MCH 30.4 26.0 - 34.0 pg    MCHC 33.7 31.0 - 36.0 g/dL    RDW 04.5 40.9 - 81.1 %    Platelets 295 135 - 450 K/uL    MPV 8.6 5.0 - 10.5 fL    Neutrophils % 67.6 %    Lymphocytes % 22.1 %    Monocytes % 8.5 %    Eosinophils % 1.3 %    Basophils % 0.5 %    Neutrophils # 8.4 (H) 1.7 - 7.7 K/uL    Lymphocytes # 2.7 1.0 - 5.1 K/uL    Monocytes # 1.1 0.0 - 1.3 K/uL    Eosinophils # 0.2 0.0 - 0.6 K/uL    Basophils # 0.1 0.0 - 0.2 K/uL   Basic Metabolic Panel w/ Reflex to MG      Result  Value Ref Range    Sodium 139 136 - 145 mmol/L    Potassium reflex Magnesium 4.1 3.5 - 5.1 mmol/L    Chloride 95 (L) 99 - 110 mmol/L    CO2 32 21 - 32 mmol/L    Anion Gap 12 3 - 16    Glucose 144 (H) 70 - 99 mg/dL    BUN 37 (H) 7 -  20 mg/dL    CREATININE 1.3 (H) 0.6 - 1.2 mg/dL    GFR Non-African American 41 (A) >60    GFR African American 50 (A) >60    Calcium 10.3 8.3 - 10.6 mg/dL   POCT Venous      Result  Value Ref Range    pH, Ven 7.405 7.350 - 7.450    pCO2, Ven 54.5 (H) 40.0 - 50.0 mm Hg    pO2, Ven 17 Not Established mm Hg    HCO3, Venous 34.2 (H) 23.0 - 29.0 mmol/L    Base Excess, Ven 9 (H) -3 - 3    O2 Sat, Ven 23 Not Established %    TC02 (Calc), Ven 36 Not Established mmol/L    Lactate 1.44 0.40 - 2.00 mmol/L    Sample Type VEN     Performed on SEE BELOW    POCT Venous      Result  Value Ref Range    POC Troponin I 0.00 0.00 - 0.10 ng/mL    Sample Type VEN     Performed on SEE BELOW    POCT Venous      Result  Value Ref Range    BNP 41.0 0.0 - 99.9 pg/mL    Sample Type VEN     Performed on SEE BELOW        RECENT VITALS:  BP: 114/62, Temp: 97.9 ????F (36.6 ????C), Pulse: 74, Resp:  (18), SpO2: 98 %     Procedures       ED Course     Nursing Notes, Past Medical Hx, Past Surgical Hx, Social Hx, Allergies, and Family Hx were reviewed.    The patient was given the following medications:  Orders Placed This Encounter     Medications     ???????  0.9 % sodium chloride bolus    ??????? cyclobenzaprine (FLEXERIL) 5 MG tablet      Sig: Take 1 tablet by mouth 3 times daily as needed for Muscle spasms     Dispense:  21 tablet     Refill:  0       CONSULTS:  None    MEDICAL DECISION MAKING / ASSESSMENT / PLAN     Cassidy Mcmillan is a 66 y.o. female who presents with 2 complaints today.  Firstly, the patient presents with recurrence of somewhat chronic left sided neck pain and stiffness, with a history of the same which was previously successfully treated with stretching exercises and Flexeril.  She has no midline spinal tenderness to   palpation.  She has no real limitation of range of motion.  She has no red flags in her history or physical examination.  She does have some tenderness to palpation in the cervical paraspinous muscles and superior trapezius  muscles.  However, this is not   the primary reason why the patient came to the emergency department today.  She is more concerned about 2 episodes of what sounds like postural lightheadedness that occurred this morning.  She did have some orthostasis on vital signs, with an increase   of her heart rate of 20 points between lying and standing, and is found to have some mild evidence of dehydration, with a moderately elevated BUN and slightly elevated creatinine.  Her workup for presyncopal symptoms is otherwise unremarkable, and after   administration of fluids, she did feel much improved.  She is noted to be on both spironolactone and torsemide, and at this time shows no evidence of fluid overload, but rather some mild evidence of dehydration, so she is asked to decrease her torsemide   dose by half for the next week, to follow-up closely with her primary care provider as an outpatient after that week, for recheck of her blood work, and reevaluation of her diuretic regimen at that time.  She was given return precautions for any   worsening symptoms or other concerns.    Clinical Impression     1. Lightheadedness    2. Mild dehydration    3. Cervical paraspinous muscle spasm        Disposition     PATIENT REFERRED TO:  Yolonda Kida, MD  [address] [place] [address]  [phone]    Call today  to discuss your ER visit, and arrange a follow-up appointment      DISCHARGE MEDICATIONS:  New Prescriptions      CYCLOBENZAPRINE (FLEXERIL) 5 MG TABLET    Take 1 tablet by mouth 3 times daily as needed for Muscle spasms       DISPOSITION Decision To Discharge    (Please note that portions of this note were completed with voice recognition software.  Efforts were made to edit the dictations but occasionally words are mis-transcribed.)        Delos Haring, MD  11/26/16 2137          ED Medication Orders     Start Ordered     Status Ordering Provider    11/26/16 1315 11/26/16 1304  0.9 % sodium chloride bolus  ONCE      Last MAR action:  Stopped - by Josem Kaufmann on 11/26/16 at 1426 Deleah Tison        Lab Results          CBC auto differential (Final  result)     Abnormal  Result time 11/26/16 12:17:26    Collection Time Result Time WBC RBC HGB HCT MCV MCH MCHC RDW PLT MPV   11/26/16 12:05:00 11/26/16 12:17:00 (!) 12.4 (H) 4.31 13.1 38.9 90.3 30.4 33.7 14.1 295 8.6       Previous Results   09/26/16 12:43:00 09/26/16 12:56:00 (!) 12.8 (H) 4.41 13.4 40.1 90.9 30.4 33.4 12.9 322 9.3   12/28/15 05:08:00 12/28/15 05:46:00 8.7 4.03 12.2 (!) 35.9 (L) 89.0 30.4 34.1 13.5 247 8.8   12/27/15 14:11:00 12/27/15 14:16:00 10.9 4.56   13.9 40.4 88.7 30.4 34.3 13.6 281 9.6   11/14/15 14:01:00 11/14/15 23:35:00 9.0 4.67 14.1 42.9 92.0 30.3 32.9 15.4 302 9.9   01/30/15 13:11:00 01/30/15 13:25:00 (!) 17.3 (H) 5.01 14.8 44.0 87.9 29.5 33.5 14.6 300 9.2       Collection Time Result Time   NEUTRO PCT LYMPHO PCT MONO PCT EOS BASOS PCT NEUTRO ABS LYMPHS ABS MONOS ABS EOS ABS BASOS ABS   11/26/16 12:05:00 11/26/16 12:17:00 67.6 22.1 8.5 1.3 0.5 (!) 8.4 (H) 2.7 1.1 0.2 0.1     Previous Results   09/26/16 12:43:00 09/26/16 12:56:00 77.3 16.8   5.1 0.1 0.7 (!) 9.8 (H) 2.1 0.7 0.0 0.1   12/28/15 05:08:00 12/28/15 05:46:00 59.1 25.4 10.9 4.0 0.6 5.2 2.2 1.0 0.3 0.1   12/27/15 14:11:00 12/27/15 14:16:00 72.5 16.0 8.5 2.1 0.9 (!) 7.9 (H) 1.7 0.9 0.2 0.1   11/14/15 14:01:00 11/14/15 23:35:00 60.4   27.6 8.2 3.1 0.7 5.4 2.5 0.7 0.3 0.1   01/30/15 13:11:00 01/30/15 13:25:00 77.1 13.0 8.2 1.3 0.4 (!) 13.4 (H) 2.2 (!) 1.4 (H) 0.2 0.1           Final result            Narrative:   Performed at: [facility] [address]  [place], [place] [address]   Phone [phone]                       Basic Metabolic Panel w/ Reflex to MG (Final result)     Abnormal  Result time 11/26/16 12:40:22    Collection Time Result Time NA krflxmg CL CO2 ANION GAP GLUCOSE BUN CREATININE GFR Non-African American GFR African American   11/26/16  12:05:00 11/26/16   12:40:00 139 4.1 (!) 95 (L) 32 12 (!) 144 (H) (!) 37 (H) (!) 1.3 (H) (!) 41 (A) >60 mL/min/1.32m2 EGFR, calc. for ages 36 and older using the MDRD formula (not corrected for weight), is valid for stable renal function.  (!) 50 (A) Chronic Kidney   Disease: less than 60 ml/min/1.73 sq.m.         Kidney Failure: less than 15 ml/min/1.73 sq.m. Results valid for patients 18 years and older.      Previous Results   09/26/16 12:42:00 09/26/16 13:17:00 (!) 131 (L) 4.3 (!) 86 (L) 25 (!) 20 (H) (!) 155 (H)   (!) 24 (H) 1.2 (!) 45 (A) >60 mL/min/1.86m2 EGFR, calc. for ages 50 and older using the MDRD formula (not corrected for weight), is valid for stable renal function.  (!) 55 (A) Chronic Kidney Disease: less than 60 ml/min/1.73 sq.m.         Kidney   Failure: less than 15 ml/min/1.73 sq.m. Results valid for patients 18 years and older.    07/23/16 20:00:00 07/23/16 20:53:00 142 5.1 (!) 96 (L) 31 15 (!)  119 (H) (!) 26 (H) 1.0 (!) 56 (A) >60 mL/min/1.82m2 EGFR, calc. for ages 69 and older using the   MDRD formula (not corrected for weight), is valid for stable renal function.  >60 Chronic Kidney Disease: less than 60 ml/min/1.73 sq.m.         Kidney Failure: less than 15 ml/min/1.73 sq.m. Results valid for patients 18 years and older.    04/10/16   11:23:00 04/10/16 23:14:00 145 4.2 (!) 93 (L) (!) 34 (H) (!) 18 (H) (!) 139 (H) 18 0.8 >60 >60 mL/min/1.39m2 EGFR, calc. for ages 9 and older using the MDRD formula (not corrected for weight), is valid for stable renal function.  >60 Chronic Kidney   Disease: less than 60 ml/min/1.73 sq.m.         Kidney Failure: less than 15 ml/min/1.73 sq.m. Results valid for patients 18 years and older.    02/14/16 08:28:00 02/14/16 16:13:00 144 4.8 (!) 98 (L) 32 14 (!) 138 (H) 16 0.7 >60 >60 mL/min/1.24m2 EGFR,   calc. for ages 61 and older using the MDRD formula (not corrected for weight), is valid for stable renal function.  >60 Chronic Kidney Disease: less than 60  ml/min/1.73 sq.m.         Kidney Failure: less than 15 ml/min/1.73 sq.m. Results valid for   patients 18 years and older.    12/28/15 05:08:00 12/28/15 05:55:00 136 4.4 (!) 97 (L) 25 14 (!) 128 (H) (!) 30 (H) 1.1 (!) 50 (A) >60 mL/min/1.72m2 EGFR, calc. for ages 39 and older using the MDRD formula (not corrected for weight), is valid for stable   renal function.  >60 Chronic Kidney Disease: less than 60 ml/min/1.73 sq.m.         Kidney Failure: less than 15 ml/min/1.73 sq.m. Results valid for patients 18 years and older.        Collection Time Result Time CALCIUM   11/26/16 12:05:00 11/26/16   12:40:00 10.3     Previous Results   09/26/16 12:42:00 09/26/16 13:17:00 10.1   07/23/16 20:00:00 07/23/16 20:53:00 10.0   04/10/16 11:23:00 04/10/16 23:14:00 10.3   02/14/16 08:28:00 02/14/16 16:13:00 9.8   12/28/15 05:08:00 12/28/15 05:55:00 9.2             Final result            Narrative:   Performed at: [facility] [address]  [place], [place] [address]   Phone [phone]                       POCT Venous (Final result)     Abnormal  Result time 11/26/16 12:09:56    Collection Time Result Time PH VEN PCO2 VEN PO2 VEN HCO3, Venous BE VEN O2 SAT VEN TC02 (Calc), Ven LACTATE Sample Type Performed on   11/26/16 12:03:00 11/26/16 12:09:00 7.405   (!) 54.5 (H) 17 (!) 34.2 (H) (!) 9 (H) 23 36 1.44 VEN SEE BELOW Performed on POC     Previous Results   11/26/16 12:02:00 11/26/16 12:14:00         VEN SEE BELOW Performed on POC   11/26/16 12:01:00 11/26/16 12:14:00         VEN SEE BELOW Performed on   POC   09/26/16 14:03:00 09/26/16 14:06:00 (!) 7.481 (H) (!) 39.1 (L) 30 (!) 29.2 (H) (!) 6 (H) 62 30 1.55 VEN SEE BELOW Performed on POC   12/27/15 14:13:00 12/27/15 14:19:00  VEN SEE BELOW Performed on POC   12/27/15 14:12:00 12/27/15 14:16:00   (!) 7.449 (H) (!) 51.3 (H) 30 (!) 35.6 (H) (!) 12 (H) 59 37 (!) 2.02 (H) VEN SEE BELOW Performed on POC           Final result             Narrative:   Performed at: [facility] [address],  [place], [place]   [address]   Phone [phone]                       POCT Venous (Final result)  Result time 11/26/16 12:15:13    Collection Time Result Time BNP Sample Type Performed on   11/26/16 12:02:00 11/26/16 12:14:00 41.0 VEN SEE BELOW Performed on POC     Previous Results   11/26/16 12:01:00 11/26/16 12:14:00    VEN SEE BELOW Performed on POC   09/26/16 14:03:00 09/26/16 14:06:00  VEN SEE BELOW Performed on POC   12/27/15 14:13:00 12/27/15 14:19:00  VEN SEE BELOW Performed on POC   12/27/15 14:12:00 12/27/15 14:16:00  VEN SEE BELOW Performed on POC   01/31/15   07:33:00 01/31/15 07:50:00   ACCU-CHEK           Final result            Narrative:   Performed at: [facility] [address],  [place], [place] [address]   Phone [phone]                       POCT Venous (Final result)  Result time 11/26/16 12:14:35    Collection Time Result Time POC Troponin I Sample Type Performed on   11/26/16 12:01:00 11/26/16 12:14:00 0.00 Point of Care result varies from laboratory result          and should not be   compared.  VEN SEE BELOW Performed on POC     Previous Results   09/26/16 14:03:00 09/26/16 14:06:00  VEN SEE BELOW Performed on POC   12/27/15 14:13:00 12/27/15 14:19:00  VEN SEE BELOW Performed on POC   12/27/15 14:12:00 12/27/15 14:16:00  VEN SEE   BELOW Performed on POC   01/31/15 07:33:00 01/31/15 07:50:00   ACCU-CHEK   01/31/15 00:04:00 01/31/15 00:06:00   ACCU-CHEK           Final result            Narrative:   Performed at: [facility] [address],  [place], [place] [address]   Phone [phone]                    Imaging Results    None     ED Administered Medications from 11/26/2016 1056 to 11/26/2016 2137       Date/Time Order Dose Route Action Action by Comments     11/26/2016 1321 0.9 % sodium chloride bolus 1,000  mL Intravenous [address] Josem Kaufmann, RN      11/26/2016 1426 0.9 % sodium chloride bolus 0 mL Intravenous Stopped Josem Kaufmann, RN         ED Treatment Team     Provider Role From To Phone Pager    Delos Haring, MD Attending Provider 11/26/16 [phone] 11/26/16 [id number]         Code Onset/Outcome  None     Code Interventions/Drips/Airways    None     Diagnoses     Diagnosis Comment    Lightheadedness     Mild dehydration     Cervical paraspinous muscle spasm         ED Prescriptions     Medication Sig Dispense Start Date End Date Auth. Provider    cyclobenzaprine (FLEXERIL) 5 MG tablet Take 1 tablet by mouth 3 times daily as needed for Muscle spasms 21 tablet 11/26/2016  Delos Haring, MD        Follow-up Information     Follow up With Specialties Details Why Contact Info    Yolonda Kida, MD Internal Medicine Call today to discuss your ER visit, and arrange a follow-up appointment [address] [place] [address] [phone]           Discharge Instructions       As discussed, your vital signs and blood work today showed that you are a little bit dehydrated.  For the next week, you should take only one tablet of torsemide daily, rather than 2 tablets.  Please call your doctor's office today, to discuss your ER   visit and arrange a follow-up appointment for about a week from now, so that you may have your blood work rechecked at that time.  Your neck pain appears associated with muscle spasm.  You should not take anti-inflammatory medications at this time, as your kidney function has been mildly affected by your dehydration. You may take cyclobenzaprine as prescribed for severe pain and   muscle spasm, but use caution, as this medication can cause sedation, and you should not drink alcohol, drive, or operate machinery while taking this medication.  You should do gentle stretching exercises as described below, as well as gentle massage, to   help loosen the tight and spasmed muscles in your neck.  Gentle heat  therapy, as with warm compresses, microwaveable heat packs, or electric heating pads, may also be helpful. Please follow-up with your primary care provider in about one week if your   pain has not begun to improve, as you may benefit from referral for physical therapy.  Please call your doctor, or return to the emergency department, if you have worsening pain, particularly if this is associated with numbness or weakness of your arms   and/or legs, worsening headaches, difficulty speaking or swallowing, vomiting, fevers, or other concerning symptoms.

## 2017-04-17 ENCOUNTER — Ambulatory Visit: Admit: 2017-04-17 | Discharge: 2017-04-17 | Payer: PRIVATE HEALTH INSURANCE | Attending: Gerontology

## 2017-04-17 DIAGNOSIS — L570 Actinic keratosis: Secondary | ICD-10-CM

## 2017-04-17 MED ORDER — hydrocortisone 2.5 % cream
2.5 | TOPICAL | 1 refills | 15.00000 days | Status: AC
Start: 2017-04-17 — End: ?

## 2017-04-17 MED ORDER — triamcinolone (KENALOG) 0.1 % cream
0.1 | TOPICAL | 3 refills | 1.00000 days | Status: AC
Start: 2017-04-17 — End: ?

## 2017-04-17 MED ORDER — ketoconazole (NIZORAL) 2 % shampoo
2 | TOPICAL | 5 refills | 30.00000 days | Status: AC
Start: 2017-04-17 — End: 2017-05-28

## 2017-04-17 NOTE — Progress Notes (Signed)
Chief Complaint   Patient presents with    Skin Exam     yrly FBSE and recheck Seb. Derm face and scalp c/o with Ketaconazole Shampoo 2 %  helping needs R/F     Prefers to be called Debbie'.  HPI: Pt is a 66 y.o. 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 No family history of MM. Here today for evaluation of the following:    1.) One year history of scaly mildly pruritic eruption on cheeks and scalp. No modifying factors. Treated since LOV for seb derm with ketoconazole 2% shampoo with improvement. Needs refills.    2.) Several month history of pruritic eruption on L upper thigh. Reports she has dry skin due to thyroid problems and age. Reports no new medications, foods or recent travel. Denies any history of environmental allergies, food allergies, insect bites, or drug allergies. Denies any new personal care products. Denies personal or family history of eczema or psoriasis.  No previous treatments.     3.) Several month history of intermittent pruritic eruption in mons pubis area with mild eruption today. Requests something she could use for the itching in that area. No modifying factors. Does not use baby wipes. No previous treatments.     4.) Multiple asymptomatic lesions over trunk and extremities for several years.  Not changing. No modifying factors. No previous treatments.     5.) Scaly spot on R preauricular for several months, asymptomatic, not changing.  No modifying factors.     Derm History:  LOV 04/17/2016  ( - ) history of NMSC or MM  ( - ) sunburns easily  ( + ) uses sunscreen  ( - ) history of tanning bed use  ( - ) family history of MM    Allergies   Allergen Reactions    Peanut Oil Hives       Current Outpatient Prescriptions   Medication Sig    acetaminophen Take by mouth.    amlodipine-benazepril Take by mouth.    aspirin-calcium carbonate Take by mouth.    benazepril Take by mouth.    calcium carbonate Chew by  mouth.    hydrOXYzine HCl TAKE 1 TABLET (ORAL) EVERY 6 HOURS AS NEEDED FOR ITCHING FOR 5 DAYS MAY CAUSE DROWSINESS    ketoconazole Wash hair and affected areas on face with shampoo three times a week for 1 month then once a week thereafter. Lather and leave on for 5-10 minutes before rinsing.    levothyroxine TAKE 1 TABLET BY ORAL ROUTE EVERY DAY    mometasone Use into each nostril.    PARoxetine Take half tablet by mouth every evening for 10 days, then take 1 tablet by mouth every evening    spironolactone Take by mouth.    torsemide Take by mouth.    triamcinolone APPLY 1 APPLICATION (TOPICAL) 2 TIMES PER DAY AS NEEDED     No current facility-administered medications for this visit.        Active Ambulatory Problems     Diagnosis Date Noted    No Active Ambulatory Problems     Resolved Ambulatory Problems     Diagnosis Date Noted    No Resolved Ambulatory Problems     No Additional Past Medical History           ADDITIONAL HISTORY:  I have reviewed past medical and surgical histories, current medications, and allergies as  documented in the patient's electronic medical record    Review of Systems:   General: Negative for fevers, chills ,fatigue  Skin: No other cutaneous concerns  Allergies: Denies seasonal or environmental allergies, medication allergies reviewed    Physical Exam:  Full skin exam  General: pleasant, in no acute distress  Skin: Examination was performed of the following: psych, scalp/hair, head/face, eyelids, lips, neck, breast/axilla/chest, abdomen, back, RUE, LUE, RLE, LLE, nails/digits and buttocks. Findings within normal limits apart from as documented below:      1.) Scalp clear and cheeks with mildly scaly erythematous patches.    2.) L upper thigh with three well-defined 6 mm to few cm round to oval to irregular shaped erythematous papules and plaques.    3.) Mons pubis with mildly erythematous patches.    4.) Trunk and extremities with rough, verrucous stuck-on brown papules and  plaques    5.) R preauricular x 1 with ill-defined, erythematous, scaly, gritty papule      A complete skin exam was completed on : 04/17/2017      A&P:  1.) Resolving Seb Derm scalp and cheeks  - reviewed the benign but chronic nature of disease with patient   - continue ketoconazole 2% shampoo once a week for maintenance, lather and leave in place for 5-10 minutes before washing off  -reviewed side effects of pruritis, irritation, and burning    2.) Nummular dermatitis L upper thigh  - reviewed benign but chronic nature of disease  - gentle skin care recommendations:  - use mild cleanser (e.g. unscented Dove for sensitive skin)   - use emollient post-bathing (Eucerin cream, Cerave cream, aquaphor or vaseline)  - avoid hot water bathing  - topical steroid side effects and use for active lesions only was reviewed  - start triamcinolone 0.1% cream applied to AA BID (avoid use on face, neck, armpit, and groin)  Counseled on steroid side effects such as lightening/thinning of the skin, avoid use longer than 2-3 weeks to avoid side effects and tachyphylaxis.    3.) Subacute dermatitis mons pubis  -discussed benign nature of disease   -start hydrocortisone 2.5 % cream bid short term  Counseled on steroid side effects such as lightening/thinning of the skin, avoid use longer than 2-3 weeks to avoid side effects and tachyphylaxis.    4.) Seborrheic keratosis trunk and extremities  -educated and reassured patient regarding benign nature of lesion  -pamphlet provided to patient  - no treatment medically necessary  -discussed treatment options if becomes inflamed in the future.   -discussed cosmetic treatment with LN2  in the future if desired-10 lesions for $100 or shave excision for $100 for removal of one lesion.    5.) Actinic Keratosis, R preauricular x 1   - Natural history reviewed including small risk of conversion to squamous cell carcinoma over time  - Importance of self-skin exam and photoprotection reviewed  -  Discussed benefits and risks of liquid nitrogen treatment, including but not limited to scarring, postinflammatory pigment changes, blistering, pain, infection  - Pt opts for treatment; procedure note as below.    Cryotherapy procedure note:  - LN2 administered for 5-10 sec x 2 cycles x one lesion  - SE reviewed including blister, scar, dyspigmentation  - Discussed post-LN2 care and natural hx of PIH and expected time course for improvement.  Patient should use vaseline or aquaphor BID until healed.    -instructed to call for an appointment  if no improvement in 4-6 weeks  Return to clinic: one year for FSE  Discussed plan with patient and patient verbalized understanding of plan of care.  Patient to call clinic with questions or concerns  Reviewed side effects of treatment(s) and/or medication(s) with patient   AVS provided or is available on [facility] Mychart

## 2017-05-28 MED ORDER — ketoconazole (NIZORAL) 2 % shampoo
2 | TOPICAL | 7 refills | 30.00000 days | Status: AC
Start: 2017-05-28 — End: 2018-04-22

## 2018-04-22 ENCOUNTER — Ambulatory Visit: Admit: 2018-04-22 | Discharge: 2018-04-22 | Payer: PRIVATE HEALTH INSURANCE | Attending: Gerontology

## 2018-04-22 DIAGNOSIS — L570 Actinic keratosis: Secondary | ICD-10-CM

## 2018-04-22 MED ORDER — ketoconazole (NIZORAL) 2 % shampoo
2 | TOPICAL | 6 refills | 30.00000 days | Status: AC
Start: 2018-04-22 — End: ?

## 2018-04-22 NOTE — Unmapped (Signed)
Chief Complaint   Patient presents with   ??? Skin Lesion     Patient here for FSE     Prefers to be called Cassidy Mcmillan'.  HPI: Pt is a 67 y.o. 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 No family history of MM. Here today for evaluation of the following:    1. Follow-up visit for few year history of scaly mildly pruritic eruption on cheeks and scalp. No modifying factors. Treated since LOV for seb derm with ketoconazole 2% shampoo with improvement. Requests refills.    2. Scaly spots on L cheek for several months, asymptomatic, not changing.  No modifying factors. Hx of AKs treated in the past with LN2 with improvement.     3. Irritated lesions on R cheek and back for several months. No modifying factors. No previous treatments.     4. Multiple asymptomatic lesions over trunk and extremities for several years.  Not changing. No modifying factors. No previous treatments.     Derm History:  LOV 04/17/2016  ( + ) history of NMSC or MM- see above  ( - ) sunburns easily  ( + ) uses sunscreen  ( - ) history of tanning bed use  ( - ) family history of MM    Allergies   Allergen Reactions   ??? Peanut Oil Hives       Current Outpatient Medications   Medication Sig   ??? acetaminophen Take by mouth.   ??? amlodipine-benazepril Take by mouth.   ??? aspirin-calcium carbonate Take by mouth.   ??? benazepril Take by mouth.   ??? calcium carbonate Chew by mouth.   ??? hydrocortisone Apply topically to affected areas in groin twice a day for no longer than two weeks at a time.   ??? hydrOXYzine HCl TAKE 1 TABLET (ORAL) EVERY 6 HOURS AS NEEDED FOR ITCHING FOR 5 DAYS MAY CAUSE DROWSINESS   ??? ketoconazole WASH HAIR & AFFECTED AREAS ON FACE 3XWEEK X1 MONTH THEN 1XWEEKLY (LEAVE ON FOR 5-10MINS THEN RINSE)   ??? levothyroxine TAKE 1 TABLET BY ORAL ROUTE EVERY DAY   ??? mometasone Use into each nostril.   ??? PARoxetine Take half tablet by mouth every evening for 10 days, then take 1 tablet  by mouth every evening   ??? spironolactone Take by mouth.   ??? torsemide Take by mouth.   ??? triamcinolone Apply topically to affected areas on left thigh twice a day for no longer than two weeks at a time.     No current facility-administered medications for this visit.        Active Ambulatory Problems     Diagnosis Date Noted   ??? No Active Ambulatory Problems     Resolved Ambulatory Problems     Diagnosis Date Noted   ??? No Resolved Ambulatory Problems     No Additional Past Medical History           ADDITIONAL HISTORY:  I have reviewed past medical and surgical histories, current medications, and allergies as documented in the patient's electronic medical record    Review of Systems:   General: Negative for fevers, chills ,fatigue  Skin: No other cutaneous concerns  Allergies: Denies seasonal or environmental allergies, medication allergies reviewed    Physical Exam:  Full skin exam  General: pleasant, in no acute distress  Skin: Examination was performed of the following: psych, scalp/hair, head/face, eyelids,  lips, neck, breast/axilla/chest, abdomen, back, RUE, LUE, RLE, LLE, nails/digits and buttocks. Findings within normal limits apart from as documented below:      1.) Scalp with few erythematous ill-defined  scaly patches.    2.) L cheek x 2 with ill-defined, erythematous, scaly, gritty papule.    3.) R cheek x 1 and back x 1 with erythematous, verrucous, well-defined plaques.    4.) Trunk and extremities with rough, verrucous stuck-on brown papules and plaques    A complete skin exam was completed on : 04/22/2018      A&P:  1.) Seb Derm scalp   - reviewed the benign but chronic nature of disease with patient   - continue ketoconazole 2% shampoo once a week for maintenance, lather and leave in place for 5-10 minutes before washing off  -reviewed side effects of pruritis, irritation, and burning    2.) Actinic Keratosis, L cheek x 2  - Natural history reviewed including small risk of conversion to squamous cell  carcinoma over time  -will treat with LN2 today in office to prevent worsening of condition  - Discussed benefits and risks of liquid nitrogen treatment, including but not limited to scarring, postinflammatory pigment changes, blistering, scabbing, pain, infection  - Pt opts for treatment; procedure note as below.    Cryotherapy procedure note:  - LN2 administered for 5-10 sec x 2 cycles x two lesions  - SE reviewed including blister, scabbing, scar, dyspigmentation  -instructed to not pick at the area.   Patient should use vaseline or aquaphor BID until healed.    -instructed to return if lesion fails to fully resolve in 4-5 weeks.    3. Inflamed seborrheic keratosis  R cheek x 1 and back x 1   -Given the irritating nature of this lesion and to prevent worsening of condition, treatment is medically indicated.  - Discussed benefits and risks of liquid nitrogen treatment, including but not limited to scarring, postinflammatory pigment changes, blistering, pain, infection.  - Pt opts for treatment; procedure note as below.   Lesion treated with two 10-12 second freeze-thaw cycle without complication. Aftercare instructions provided.     Cryotherapy procedure note:  - LN2 administered for 10-12 seconds x 2 freeze-thaw cycles x two lesions without complications  - SE reviewed including blister, scar, dyspigmentation  - Discussed post-LN2 care and natural hx of PIH and expected time course for improvement. Should this lesion recur, the patient was told to return.   Patient should use vaseline or aquaphor BID until healed.      4.) Seborrheic keratosis trunk and extremities  -educated and reassured patient regarding benign nature of lesion  -pamphlet provided to patient  - no treatment medically necessary  -discussed treatment options if becomes inflamed in the future.   -discussed cosmetic treatment with LN2  in the future if desired-10 lesions for $100 or shave excision for $100 for removal of one lesion.  ??  Return to  clinic: one year for FSE  Discussed plan with patient and patient verbalized understanding of plan of care.  Patient to call clinic with questions or concerns  Reviewed side effects of treatment(s) and/or medication(s) with patient   AVS provided or is available on [facility] Mychart  ??

## 2018-04-23 ENCOUNTER — Encounter: Attending: Gerontology

## 2019-04-28 ENCOUNTER — Encounter: Attending: Gerontology

## 2019-05-12 ENCOUNTER — Encounter: Payer: PRIVATE HEALTH INSURANCE | Attending: Gerontology

## 2020-02-29 IMAGING — MR MRI JOINT UPPER EXTREMITY WITHOUT CONTRAST RT
11 series · 40 of 40 positions shown · non-contrast
Comparison: none

﻿

MAGNETIC RESONANCE IMAGING RIGHT SHOULDER WITHOUT CONTRAST ADMINISTRATION
HISTORY: Impingement syndrome.  Diminished range of motion.  Claustrophobia.
TECHNIQUE: Multiplanar magnetic resonance imaging of the right shoulder joint was accomplished utilizing a surface coil and an open 0.[employer] scanner.  T1-weighted, intermediate intensity, and T2-weighted scanned sections obtained.

[Series 1: scano cor · coronal · right · 6.0mm · 0.94mm/px · 3 of 10 slices shown]
[im 1/10]
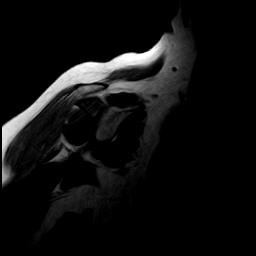
[im 5/10]
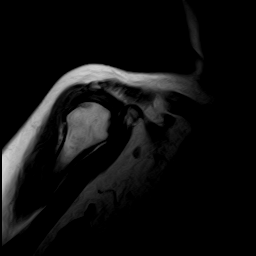
[im 10/10]
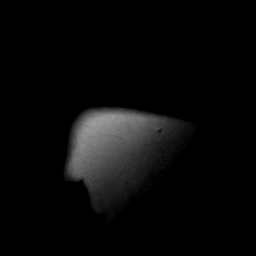

[Series 2: scano sag · sagittal · right · 4.0mm · 0.94mm/px · 1 of 7 slices shown]
[im 1/7]
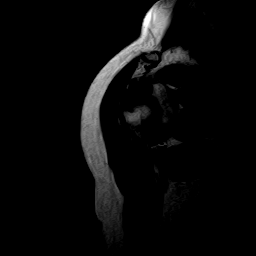

[Series 3: PD · axial · right · 4.0mm · 0.74mm/px · z∈[-9,+66]mm · 4 of 18 slices shown (1 of 2)]
[im 1/18]
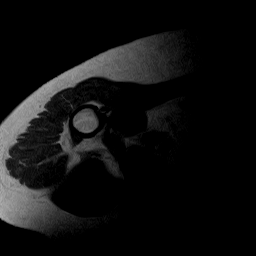
[im 6/18]
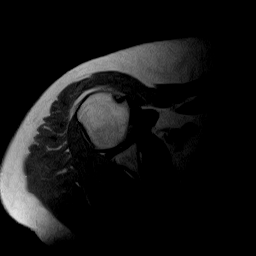
[im 12/18]
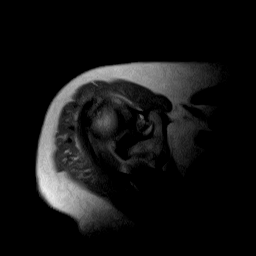
[im 18/18]
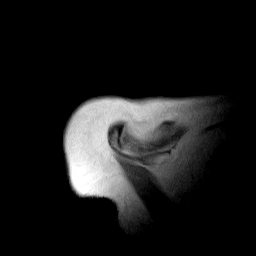

[Series 4: z ir cor · oblique · right · 4.0mm · 0.74mm/px · 3 of 16 slices shown]
[im 1/16]
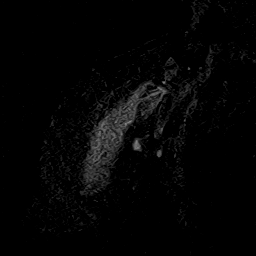
[im 8/16]
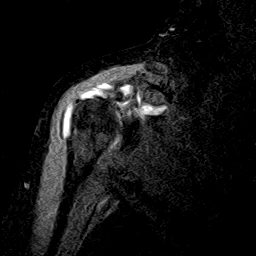
[im 16/16]
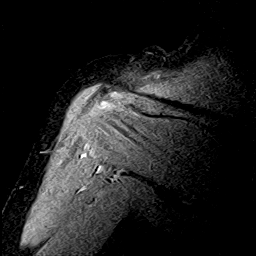

[Series 5: PD · oblique · right · 4.0mm · 0.74mm/px · 7 of 32 slices shown (2 of 2)]
[im 1/32]
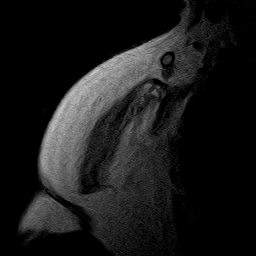
[im 6/32]
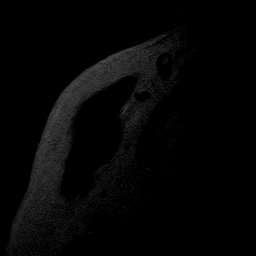
[im 11/32]
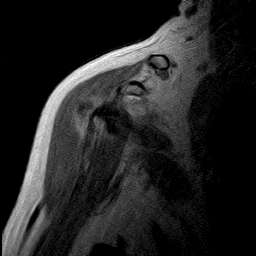
[im 16/32]
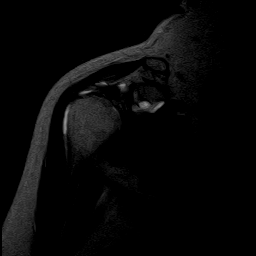
[im 21/32]
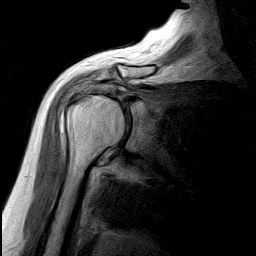
[im 26/32]
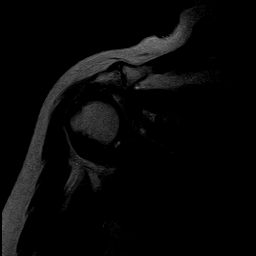
[im 32/32]
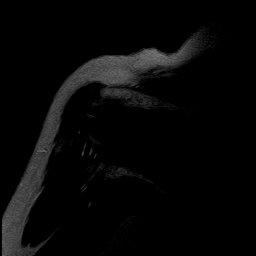

[Series 6: cor te 20 · oblique · right · 4.0mm · 0.74mm/px · 3 of 16 slices shown]
[im 1/16]
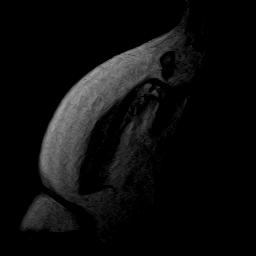
[im 8/16]
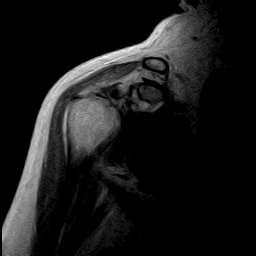
[im 16/16]
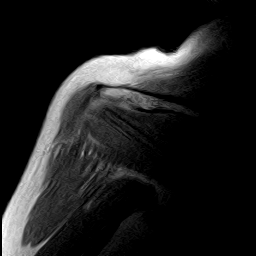

[Series 7: cor te 90 · oblique · right · 4.0mm · 0.74mm/px · 3 of 16 slices shown]
[im 1/16]
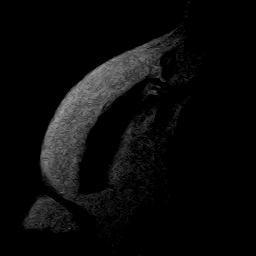
[im 8/16]
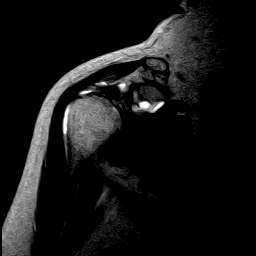
[im 16/16]
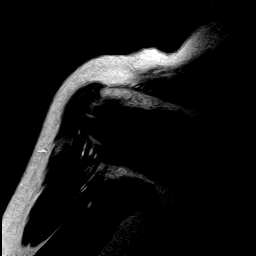

[Series 8: sag de · oblique · right · 4.0mm · 0.74mm/px · 7 of 32 slices shown]
[im 1/32]
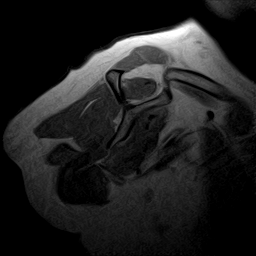
[im 6/32]
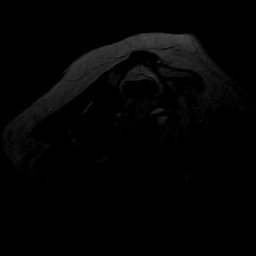
[im 11/32]
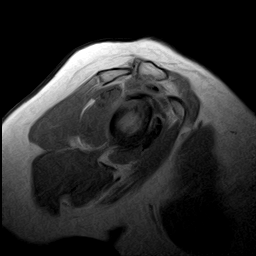
[im 16/32]
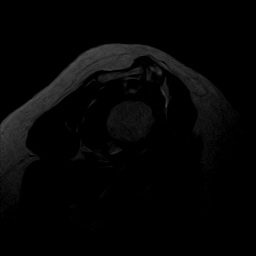
[im 21/32]
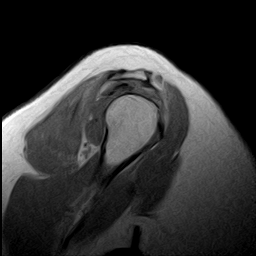
[im 26/32]
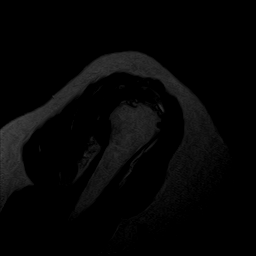
[im 32/32]
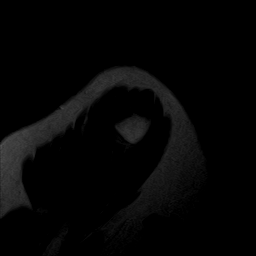

[Series 9: sag te 20 · oblique · right · 4.0mm · 0.74mm/px · 3 of 16 slices shown]
[im 1/16]
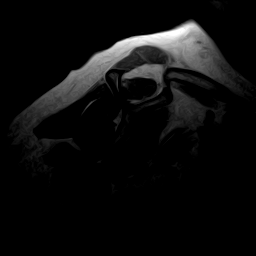
[im 8/16]
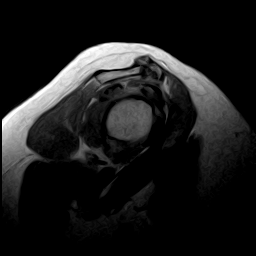
[im 16/16]
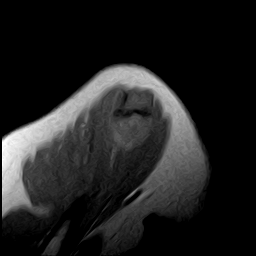

[Series 10: sag te 90 · oblique · right · 4.0mm · 0.74mm/px · 3 of 16 slices shown]
[im 1/16]
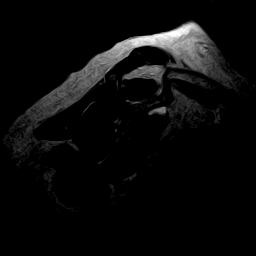
[im 8/16]
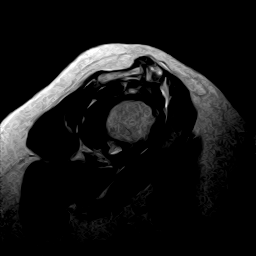
[im 16/16]
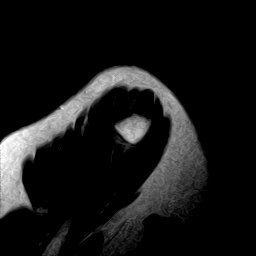

[Series 11: T1 · oblique · right · 4.0mm · 0.37mm/px · 3 of 16 slices shown]
[im 1/16]
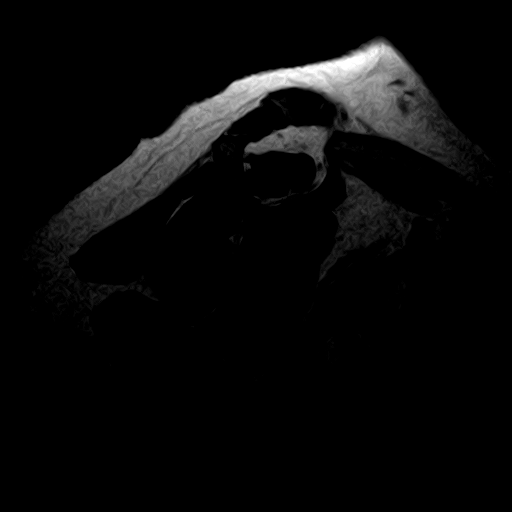
[im 8/16]
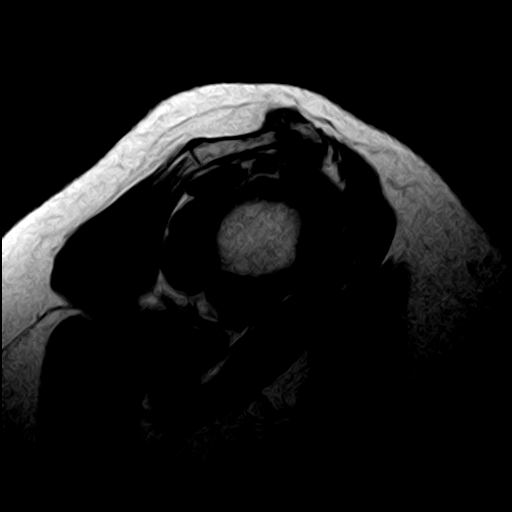
[im 16/16]
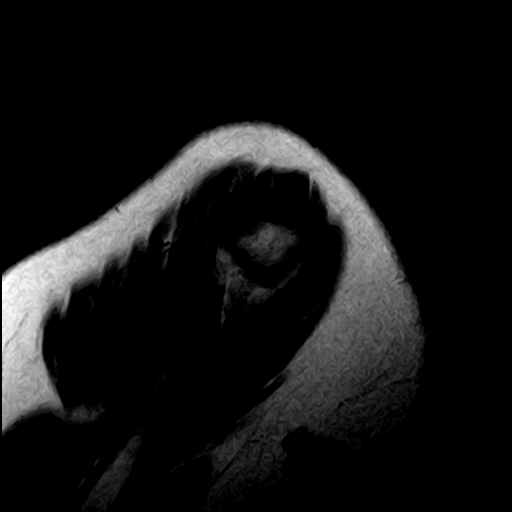

[40 of 40 positions shown; findings below may reference images not displayed]

FINDINGS: No prior studies are available for comparison purposes. 

There is mild patient motion and spacial mismapping on all imaging sequences.  Patient claustrophobic. 

There is prominent degenerative and hypertrophic change of the acromioclavicular joint space associated with a small joint effusion and small region of cystic change involving the distal clavicle.  There is moderate associated acromial outlet narrowing.  There is diffuse supraspinatus greater than infraspinatus tendinitis or tendinosis.  There is complete tearing of the anterior aspects of the supraspinatus tendon with localized 15 mm proximal tendon retraction.  A small partial articular surface tear of the infraspinatus tendon may be present as well.  There are small to moderate sized subacromial and subdeltoid bursal effusions, likely related to complete rotator cuff tearing.  

Moderate osteoarthritic change of glenohumeral joint space apparent with chondral articular surface irregularity and joint space narrowing.  There is a small glenohumeral joint effusion.  Degenerative regions of cystic change and sclerosis involving humeral head and osseous labrum.  Biceps tendon difficult to assess proximally due to excessive patient motion.
IMPRESSION: 1. Moderate acromial outlet narrowing secondary to AC joint degenerative and hypertrophic change. 

2. Complete tear supraspinatus tendon.  There may be a small partial articular surface tear of the infraspinatus tendon.  There is diffuse tendinitis or tendinosis. 

3. Moderate sized subacromial-subdeltoid bursa effusions, either secondary to complete rotator cuff tearing or bursitis.

4. Moderate glenohumeral osteoarthritic change with small joint effusion.

5. Please see in-depth discussion in body of report.

## 2020-04-20 IMAGING — MR MRI CERVICAL SPINE WITHOUT CONTRAST
7 of 8 series · 36 of 48 positions shown · non-contrast
Comparison: none

﻿

MAGNETIC RESONANCE IMAGING CERVICAL SPINE WITHOUT CONTRAST ADMINISTRATION
HISTORY: Cervical radiculopathy.  Claustrophobia.
TECHNIQUE: Multiplanar magnetic resonance imaging of the cervical spine was accomplished utilizing a surface coil and an open 0.3[employer] scanner without intravenous contrast administration.  T1 and T2-weighted thin slice sections were obtained.

[Series 1: scano s/c · axial · 5.0mm · 1.02mm/px · z∈[-8,+130]mm · 5 of 14 slices shown]
[im 1/14]
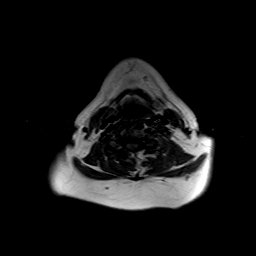
[im 4/14]
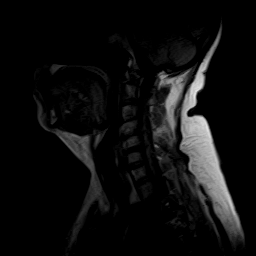
[im 7/14]
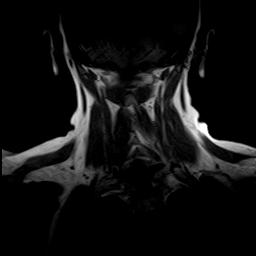
[im 10/14]
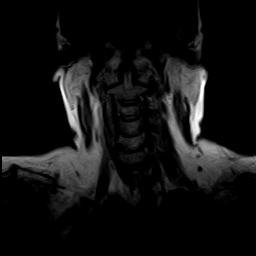
[im 14/14]
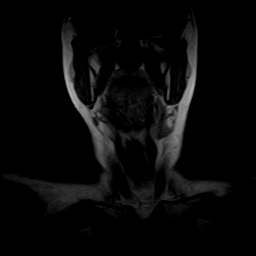

[Series 2: sag fir · sagittal · 3.0mm · 0.94mm/px · 4 of 13 slices shown]
[im 1/13]
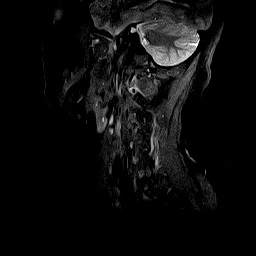
[im 5/13]
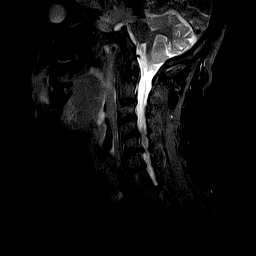
[im 9/13]
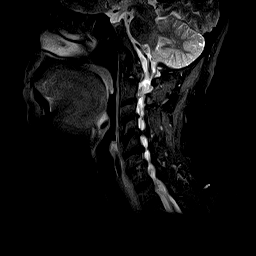
[im 13/13]
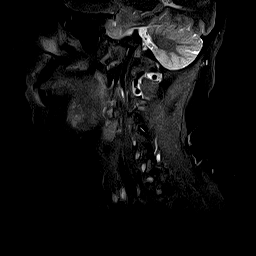

[Series 3: T2 · sagittal · 3.0mm · 0.94mm/px · 4 of 13 slices shown]
[im 1/13]
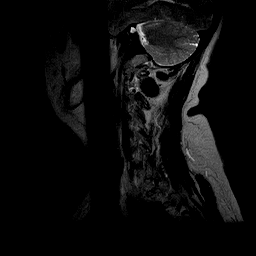
[im 5/13]
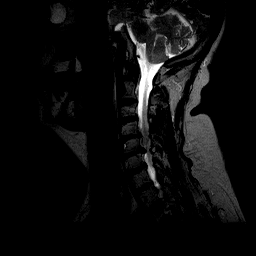
[im 9/13]
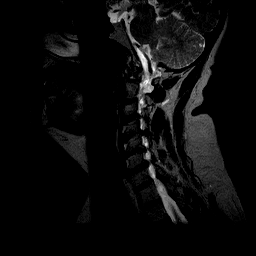
[im 13/13]
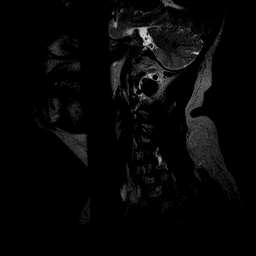

[Series 4: trs ge w/mtc · axial · 3.0mm · 0.94mm/px · z∈[-100,-2]mm · 9 of 26 slices shown]
[im 1/26]
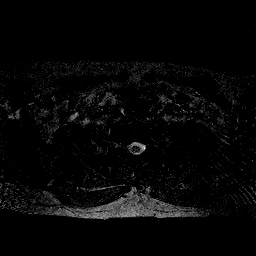
[im 4/26]
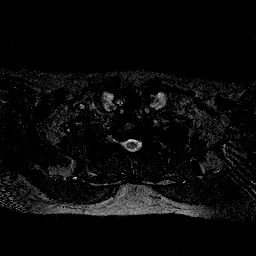
[im 7/26]
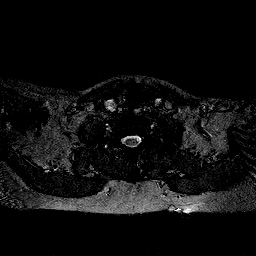
[im 10/26]
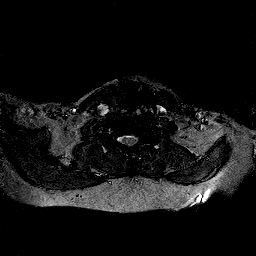
[im 13/26]
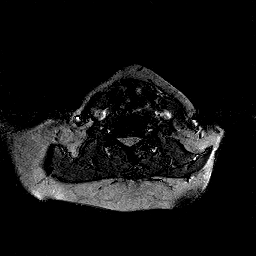
[im 16/26]
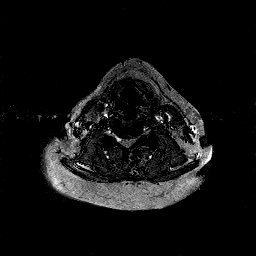
[im 19/26]
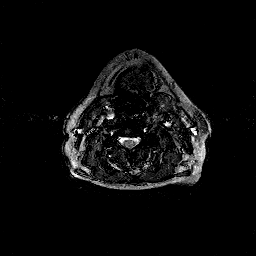
[im 22/26]
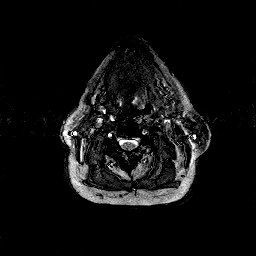
[im 26/26]
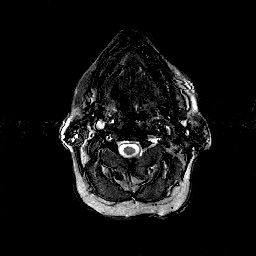

[Series 5: (id) · axial · 3.0mm · 0.94mm/px · z∈[-100,-2]mm · 9 of 26 slices shown]
[im 1/26]
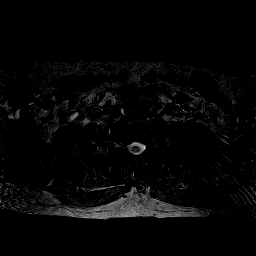
[im 4/26]
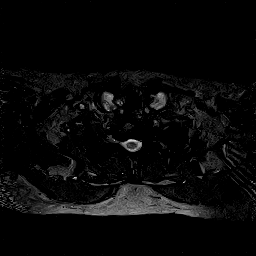
[im 7/26]
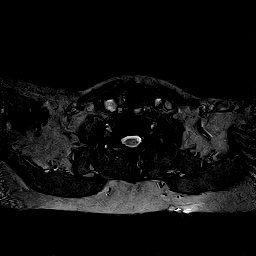
[im 10/26]
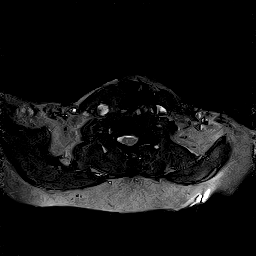
[im 13/26]
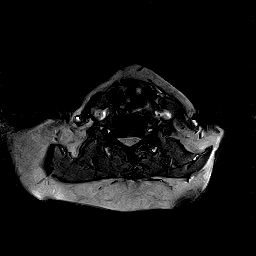
[im 16/26]
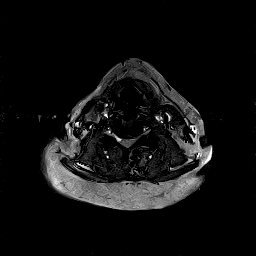
[im 19/26]
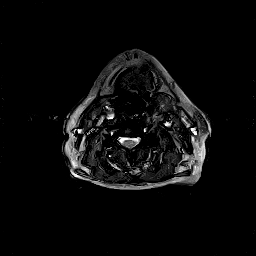
[im 22/26]
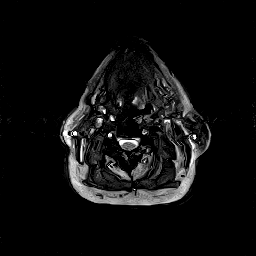
[im 26/26]
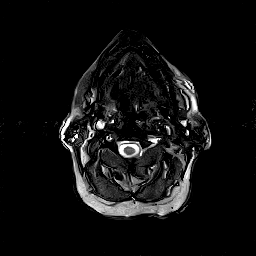

[Series 6: T1 · sagittal · 3.0mm · 0.94mm/px · 4 of 13 slices shown]
[im 1/13]
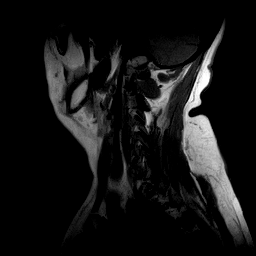
[im 5/13]
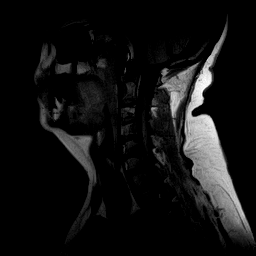
[im 9/13]
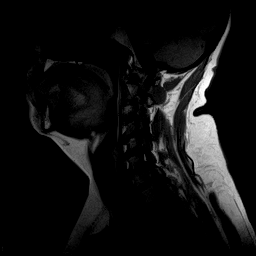
[im 13/13]
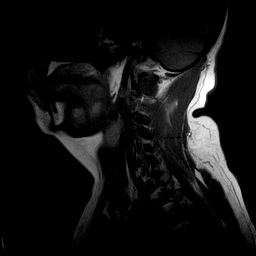

[Series 7: cor shim · coronal · 10.0mm · 4.69mm/px · 1 of 3 slices shown]
[im 1/3]
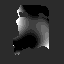

[36 of 48 positions shown; findings below may reference images not displayed]

FINDINGS: No prior studies are available for comparison purposes. 

Mild to moderate cervical dextroscoliosis present.  Anterior to posterior alignment maintained.  The cervical vertebrae themselves appear normal in height.  There are no significant abnormalities of regional bone marrow signal. 

The region of the foramen magnum appears normal.  No significant focal abnormalities of visualized paraspinal soft tissues.  

Moderately severe generalized cervical spondylosis and degenerative disc disease is present.  There is generalized cervical disc desiccation and disc space narrowing.  There is prominent extradural spurring and facet joint sclerosis and minor overgrowth dorsally.

Examination of the C5-C6 interspace demonstrates significant spondylosis with large ventral disc and spur complex and small disc protrusion.  There is significant central and right greater than left lateral cervical spinal canal stenosis with cord compression and attenuation.  AP diameter of spinal canal focally estimated at 4.5-5 mm.  There are moderately severe bilateral C6 neural foraminal stenoses, right greater than left.    

Examination of the C4-C5 interspace demonstrates spondylosis with ventral disc and spur complex and small broad-based central disc protrusion.  Central and lateral cervical spinal canal stenosis again observed with mild cord compression and ventral flattening.  AP diameter of spinal canal estimated at approximately 7 mm focally.  There are moderate left greater than right bilateral C5 neural foraminal stenoses.  

Examination of the C6-C7 interspace demonstrates spondylosis with small to moderate sized ventral disc and spur complex and tiny central disc protrusion.  Disc and spur may contact the ventral cord substance though there is no significant cord compression or attenuation.  Disc and spurring eccentric lateral to left with moderate to severe left and mild to moderate right C7 neural foraminal stenoses.  

Early spondylosis and broad-based degenerative disc and annular bulging C3-C4 interspace with eccentric spurring to left.  There is no significant central or lateral cervical spinal canal stenosis.  Moderately severe stenosis of the left C4 neural foramen present. 

Early spondylosis C2-C3 interspace without canal or foraminal compromise.  There are no significant extradural abnormalities at the C1-C2 articulation nor C7-T1 interspace.  

There is patient motion and resulting artifact from making evaluation of cervical cord signal somewhat difficult.  There is no clear evidence for localized cord enlargement.
IMPRESSION: 1. Moderately severe generalized cervical spondylosis and degenerative disc disease as described. 

2. Dextroscoliosis.

3. Significant central and lateral cervical spinal canal stenoses at C5-C6 greater than C4-C5 interspaces as discussed.  There are significant bilateral cervical neural foraminal stenoses as described by level in body of report.

4. Please see in-depth discussion in body of report.

## 2020-05-25 ENCOUNTER — Ambulatory Visit: Admit: 2020-05-25 | Discharge: 2020-05-25 | Payer: PRIVATE HEALTH INSURANCE | Attending: Gerontology

## 2020-05-25 DIAGNOSIS — D485 Neoplasm of uncertain behavior of skin: Secondary | ICD-10-CM

## 2020-05-25 MED ORDER — fluocinonide (LIDEX) 0.05 % cream
0.05 | Freq: Two times a day (BID) | TOPICAL | 1 refills | Status: AC
Start: 2020-05-25 — End: 2020-05-25

## 2020-05-25 MED ORDER — fluocinonide (LIDEX) 0.05 % cream
0.05 | Freq: Two times a day (BID) | TOPICAL | 1 refills | Status: AC
Start: 2020-05-25 — End: 2020-07-12

## 2020-05-25 NOTE — Unmapped (Signed)
Dermatology    You underwent a biopsy today which will be sent to a specialized laboratory that will process the tissue.  Results generally take about 7 days to return to our office. Please contact us at [phone] with any questions    Care for your biopsy site   After 24 hours, remove the bandage. You may shower or bathe. Clean the wound with mild soapy water. Gently pat area dry, do not rub site. Apply Vaseline or Aqua Phor to the procedure site using a Q-tip then cover with a clean band-aid. Repeat this process daily until the biopsy site is well healed. If you have stitches, please make sure to schedule an appointment to have them removed. If you experience any pain or discomfort, you can take Tylenol as needed  A certain amount of redness around the wound is expected as the wound heals. Signs of infection include: excessive redness, excessive oozing, pus like drainage, foul odor, swelling, or tenderness. Please call our office to notify your provider with any signs of infection

## 2020-05-25 NOTE — Unmapped (Signed)
Patient called RX sent to wrong pharmacy needs to go to [facility] [address]

## 2020-05-25 NOTE — Unmapped (Signed)
Chief Complaint   Patient presents with   ??? ck lesions fse     HPI: Pt is 70 y.o. White or Caucasian  female who presents today for evaluation of the following:    1.  Asymptomatic lesion on right cheek x 1 year. Reports recent bleeding of lesion while rubbing. No tx     2.  Asx eruption on legs x 1+ year. No tx. Initially more purple, lighter now    3.  Asymptomatic brown lesions on body x years. No noted changes.    4.  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 Peanut Oil Hives     Derm History: LOV 04/22/18 per PN   (-)  family history of melanoma  (+)  personal history of NMSC or MM  (NMSC on upper back 20+ years ago)   (-)  sunburns easily  (+)  uses sunscreen  (-)  history of tanning bed use    Physical Examination: Full Skin Exam    Examination was performed of the following: scalp/hair, head/face, conjunctivae/eyelids, gums/teeth/lips, neck, breast/axilla/chest, abdomen, back, RUE, LUE, RLE, LLE, nails/digits, & groin/buttocks    Abnormalities noted include: head/face, right lower extremity and left lower extremity    Appears well nourished, alert and oriented x 3    1.  Right infraorbital cheek with 6mm pink pearly centrally eroded smooth papule  2.  Both legs with violaceous- erythematous annular smooth patches   3.  Face, trunk, and extremities with scattered uniformly colored brown macules and stuck on papules  4.  Upper back with smooth scar, no visible or palpable lesions            A & P:   1.  Right infraorbital cheek.............. R/O BCC vs [facility] vs other   Tangential biopsy  - patient educated re: pain, bleeding, scar, s/s of infection, & dyspigmentation  - informed consent obtained & time out performed   - site cleansed with alcohol & anesthetized locally with 1% Lidocaine with Epinephrine   - tangential biopsy performed to the dermal layer of skin and sent to pathology  - hemostasis obtained & dressing applied   - advised on wound care with  daily vaseline or aquaphor & bandage until healed (written instructions provided)  - advised post surgical area may appear red which is normal. Report any abnormal drainage or odor from site    2. C/w Granuloma Annulare  - education and reassurance of benign, self limiting nature  - advised spontaneous resolution can take up to 2 years, can recur, & no cure available   - start Lidex 0.05% cream bid           - reviewed side effects of skin atrophy, telangectasia, & tachyphylaxis and advised not to use longer than 3 weeks at a time      - follow up 8 weeks - consider bx if not improved    3. SKs, lentigines   - benign reassurance   - discussed the many different presentations of these lesions and that they change occasionally  - advised that cosmetic treatment with LN2 is $100 for up to15 lesions and lesions may recur, not completely resolve, or result in scar      4.  Hx of NMSC  - no signs of recurrence on exam today   - skin cancer education & ABCDE's reviewed  -  educated regarding sun avoidance, using sunscreen with SPF 30-50 and  sun-protective clothing including hats  - annual FSE, sooner if changes      - discussed and reviewed plan of care including, side effects of treatment(s) and/or medication(s) with patient. AVS provided to patient   - patient to call with any questions or concerns.

## 2020-07-07 ENCOUNTER — Ambulatory Visit: Admit: 2020-07-07 | Discharge: 2020-07-07 | Payer: PRIVATE HEALTH INSURANCE

## 2020-07-07 DIAGNOSIS — C44319 Basal cell carcinoma of skin of other parts of face: Secondary | ICD-10-CM

## 2020-07-07 NOTE — Unmapped (Signed)
Chief Complaint   Patient presents with   ??? Basal Cell Carcinoma     Presents for Mohs on the right infraorbital cheek       HPI: Pt is 70 y.o. female here for mohs surgery of a biopsy proven basal cell carcinoma, nodular located on the right infraorbital cheek.  Biopsy was done by Blima Singer, CNP. Patient denies prior treatment to the site.  Lesion has been present for months.  Lesion is mild in severity.  Patient is now here for further surgical management of the skin cancer.       Review of Systems   Skin as above.  Constitutional: Negative for fever and chills.   Respiratory: Negative for cough and shortness of breath.??   Neurological: Negative for light-headedness and headaches.   Hematological: Does not bruise/bleed easily.   Allergies reviewed   Allergies   Allergen Reactions   ??? Peanut Oil Hives          Current Outpatient Medications:   ???  acetaminophen (TYLENOL) 500 MG tablet, Take by mouth., Disp: , Rfl:   ???  amlodipine-benazepril (LOTREL) 10-20 mg per capsule, Take by mouth., Disp: , Rfl:   ???  aspirin-calcium carbonate 81 mg-300 mg calcium(777 mg) Tab, Take by mouth., Disp: , Rfl:   ???  benazepril (LOTENSIN) 20 MG tablet, Take by mouth., Disp: , Rfl:   ???  calcium carbonate (OS-CAL) 500 mg calcium (1,250 mg) chewable tablet, Chew by mouth., Disp: , Rfl:   ???  fluocinonide (LIDEX) 0.05 % cream, Apply topically 2 times a day. To affected areas on legs. Do not use longer than 3 weeks at a time, Disp: 60 g, Rfl: 1  ???  hydrocortisone 2.5 % cream, Apply topically to affected areas in groin twice a day for no longer than two weeks at a time., Disp: 28 g, Rfl: 1  ???  hydrOXYzine HCl (ATARAX) 25 MG tablet, TAKE 1 TABLET (ORAL) EVERY 6 HOURS AS NEEDED FOR ITCHING FOR 5 DAYS MAY CAUSE DROWSINESS, Disp: , Rfl:   ???  ketoconazole (NIZORAL) 2 % shampoo, Wash hair with shampoo three times a week for 1 month then once a week thereafter. Lather and leave on for 5-10 minutes before rinsing., Disp: 120 mL, Rfl: 6  ???   levothyroxine (SYNTHROID, LEVOTHROID) 125 MCG tablet, TAKE 1 TABLET BY ORAL ROUTE EVERY DAY, Disp: , Rfl:   ???  mometasone (NASONEX) 50 mcg/actuation nasal spray, Use into each nostril., Disp: , Rfl:   ???  PARoxetine (PAXIL) 20 MG tablet, Take half tablet by mouth every evening for 10 days, then take 1 tablet by mouth every evening, Disp: , Rfl:   ???  spironolactone (ALDACTONE) 100 MG tablet, Take by mouth., Disp: , Rfl:   ???  torsemide (DEMADEX) 20 MG tablet, Take by mouth., Disp: , Rfl:   ???  triamcinolone (KENALOG) 0.1 % cream, Apply topically to affected areas on left thigh twice a day for no longer than two weeks at a time., Disp: 30 g, Rfl: 3      Physical Examination:  A&O x 3, pleasant   Examination was performed of the following:  head/face and conjunctivae/eyelids    right infraorbital cheek - depressed pink pearly papule 1.0 cm    A & P:   1. Basal cell carcinoma, nodular - right infraorbital cheek  I reviewed the pathology with the patient and mohs surgery.  We discussed risks and benefits of the surgical procedure.  Mohs surgery operative  note    Indications for Mohs Micrographic Surgery: Tumor Location, Tumor Size, Tissue Site, Tissue Conservation and Ill-defined Margins    Signed informed consent was obtained.      Referring Physician: Sharalyn Ink, MD   Premedication: none  Post op medication: none  Surgeon:   Demetria Pore. Sherre Scarlet, DO  Assistant:   Technician: Minette Brine   Type of Tumor: BCC  Lesion: primary  Location: right infraorbital cheek  Preoperative Size: 1.0 x 0.6 cm  Postoperative Size: 1.8 x 0.9 cm  Anesthesia: 1% Lidocaine with Epinephrine 1/200000  Anesthesia Amount: 3 ml    Operation:   Surgical excision of cutaneous malignancy with continuous microscopic control (Mohs Micrographic Surgery).   Preparation: Prior biopsy on the above site was confirmed and pathology report obtained.  The patient confirmed the location of the biopsy site by looking into a mirror and pointing to the  location.  The area of clinically apparent tumor was outlined using a surgical pen to define the margin.  The operative site was surgically prepped with betadine.  The area was then draped with a sterile drape and locally anesthetized.  This was done for each surgical stage of the procedure.     The lesion was removed by Mohs surgery, fresh-tissue technique in 1 stage with 1 section per stage. Stage 1(-, peripheral and deep margins were clear on initial sections on deeper sections tumor was noted but initial sections were clear).  All tissue, which was removed from the lesion site, was examined by frozen section technique with detailed mappings of the tissue and with Dr. Sherre Scarlet reading the slides in the manner of Mohs chemosurgery or microscopically controlled excision.  At the completion of the surgery, the deep and peripheral margins were free of tumor.     Repair:  Various closure modalities were discussed with the patient, and it was decided that a complex layered closure would best preserve normal anatomic and functional relationships. Additional risk of wound dehiscence was discussed. A complex repair was necessary due to extensive wide undermining to avoid distortion to an adjacent free tissue margin (lower eyelid).     The patient was placed on the operating room table. The area was anesthetized with Lidocaine 1% with epinephrine with added sodium bicarbonate, was given a sterile prep using betadine, and draped in the usual sterile fashion. Recreation and enlargement of the wound was performed by excising cones of tissue via the triangulation technique.    The final incision lines were placed with respect for the patient's natural skin tension lines in a linear configuration to avoid functional and aesthetic distortion of adjacent free margins. Following extensive undermining, meticulous hemostasis was obtained with spot electrocoagulation.  Subcutaneous dead space, dermis, and epidermis were closed with 5-0  Monocryl and 6-0 gut using simple interrupted stitches.  The length of the final incision line was 3.0 cm.        Follow up as needed  Wound care reviewed  Call with any concerns  Sutures will dissolve

## 2020-07-07 NOTE — Unmapped (Signed)
Patient Consultation Questionnaire   Site of skin cancer: right infraorbital cheek  Histologic diagnosis: BCC  How long ago did you notice this lesion? 8 months  Symptoms and signs of the skin cancer we are treating: -  Duration of symptoms: -  Have you had any prior treatment to this lesion? No  Have you had any other skin cancers? Yes;   Where: back   How long ago: few years ago  Are you on any blood thinners? Yes;   Taking: aspirin, fish oil   How Often: daily  Do you have any artificial joints or heart valves? No  Do you have a pacemaker or defibrillator?  No

## 2020-07-12 ENCOUNTER — Ambulatory Visit: Admit: 2020-07-12 | Discharge: 2020-07-12 | Payer: PRIVATE HEALTH INSURANCE | Attending: Gerontology

## 2020-07-12 DIAGNOSIS — R21 Rash and other nonspecific skin eruption: Secondary | ICD-10-CM

## 2020-07-12 MED ORDER — fluocinonide (LIDEX) 0.05 % cream
0.05 | Freq: Two times a day (BID) | TOPICAL | 2 refills | Status: AC
Start: 2020-07-12 — End: ?

## 2020-07-12 NOTE — Unmapped (Signed)
Chief Complaint   Patient presents with   ??? Follow-up     GA, pt says is better     HPI: Pt is 70 y.o. White or Caucasian  female who presents today for evaluation of the following:    1.  Follow up on 1+ year history of asx eruption on legs with marked improvement. Treated with Lidex 0.05% cream for GA. Used for 3 weeks, now much lighter         Addressed at prior visit  -Asymptomatic brown lesions on body x years. No noted changes.    -Hx of NMSCs as below       -07/2020, nBCC Right infraorbital cheek s/p Mohs per Dr Anthonette Legato        -20+ years ago, NMSC upper back, uncertain type    Allergies   Allergen Reactions   ??? Peanut Oil Hives   ??? Statins-Hmg-Coa Reductase Inhibitors      Other reaction(s): Other (See Comments)  Joint pains      Derm History: LOV 07/07/20 per Dr Anthonette Legato   (-)  family history of melanoma  (+)  personal history of NMSC or MM  (NMSCs as above)   (-)  sunburns easily  (+)  uses sunscreen  (-)  history of tanning bed use    Physical Examination: Limited Skin Exam    Examination was performed of the following:  RLE, LLE    Abnormalities noted include:     Appears well nourished, alert and oriented x 3    1. Both legs with faintly violaceous smooth patches         A & P:   1.  C/w Granuloma Annulare, markedly improved  - education and reassurance of benign, self limiting nature  - advised spontaneous resolution can take up to 2 years, can recur, & no cure available   - continue Lidex 0.05% cream bid prn          - reviewed side effects of skin atrophy, telangectasia, & tachyphylaxis and advised not to use longer than 3 weeks at a time        FSE 6 months, sooner prn    Addressed at prior visit  SKs, lentigines   - benign reassurance   - discussed the many different presentations of these lesions and that they change occasionally  - advised that cosmetic treatment with LN2 is $100 for up to15 lesions and lesions may recur, not completely resolve, or result in scar      Hx of NMSCs  - no signs of  recurrence on exam today   - skin cancer education & ABCDE's reviewed  - educated regarding sun avoidance, using sunscreen with SPF 30-50 and  sun-protective clothing including hats  - annual FSE, sooner if changes      - discussed and reviewed plan of care including, side effects of treatment(s) and/or medication(s) with patient. AVS provided to patient   - patient to call with any questions or concerns.

## 2021-01-04 ENCOUNTER — Ambulatory Visit: Admit: 2021-01-04 | Discharge: 2021-01-04 | Payer: PRIVATE HEALTH INSURANCE | Attending: Gerontology

## 2021-01-04 DIAGNOSIS — L821 Other seborrheic keratosis: Secondary | ICD-10-CM

## 2021-01-04 MED ORDER — ketoconazole (NIZORAL) 2 % cream
2 | Freq: Two times a day (BID) | TOPICAL | 5 refills | 30.00000 days | Status: AC
Start: 2021-01-04 — End: ?

## 2021-01-04 NOTE — Unmapped (Signed)
Chief Complaint   Patient presents with   ??? Skin Lesion     6 m FSE     HPI: Pt is 70 y.o. White or Caucasian  female who presents today for evaluation of the following:    1.  Recurrent pruritic eruption in groin. Worse when hot     2.  Asx lesion on lip x few weeks. No changes. No known trauma or lip biting     3.  Pruritic lesion on back, no changes.    4.  Asymptomatic brown lesions on body x years. No noted changes.    5.  Hx of NMSCs as below- no concerns at sites              -07/2020, nBCC right infraorbital cheek s/p Mohs per Dr Sherre Scarlet              -20+ years ago, upper back, uncertain type            Addressed at prior visit  -Hx of Granuloma Annulare tx'd with Lidex 0.05% cream     Allergies   Allergen Reactions   ??? Peanut Oil Hives   ??? Statins-Hmg-Coa Reductase Inhibitors      Other reaction(s): Other (See Comments)  Joint pains      Derm History: LOV 07/12/20  (-)  family history of melanoma  (+)  personal history of NMSC or MM  (NMSC on upper back 20+ years ago)   (-)  sunburns easily  (+)  uses sunscreen  (-)  history of tanning bed use    Physical Examination: Full Skin Exam    Examination was performed of the following: scalp/hair, head/face, conjunctivae/eyelids, gums/teeth/lips, neck, breast/axilla/chest, abdomen, back, RUE, LUE, RLE, LLE, nails/digits, & groin/buttocks    Abnormalities noted include: groin, back     Appears well nourished, alert and oriented x 3    1.  Inguinal folds with moist mildly erythematous patches  2.  Left lower lip with with violaceous smooth papule  3.  Lower back x1 with erythematous-tan, verrucous, well-defined papule  4.  Face, trunk, and extremities with scattered uniformly colored brown macules and stuck on papules  5.  Right cheek and back with smooth scars, no visible or palpable lesions      A & P:   1.  Intertrigo - inguinal creases with mild flaring   - education  - start Ketoconazole 2% cream twice daily to affected area  - advised to keep intertriginous area  clean, dry, & cool   - wear loose fitting clothing made of cotton  - Zeasorb to help control moisture when inflammation resolves     2.  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  ISK x1  - benign in nature, however given the irritating nature of this lesion and to prevent worsening of condition, LN2 treatment recommended   - discussed benefits and risks of liquid nitrogen treatment, including but not limited to scarring, postinflammatory pigment changes, blistering, pain, & infection.  - Pt opts for treatment; Lesion treated with LN2   - Pt advised to expect blistering and/or scabbing reaction & not to pick spots   - discussed post LN2 care with vaseline/aquaphor to area until healed    4. SKs, lentigines   - benign reassurance   - discussed the many different presentations of these lesions and that they change occasionally  - advised that cosmetic treatment with  LN2 is $100 for up to15 lesions and lesions may recur, not completely resolve, or result in scar      5.  Hx of NMSCs  - no signs of recurrence on exam today   - skin cancer education & ABCDE's reviewed  - educated regarding sun avoidance, using sunscreen with SPF 30-50 and  sun-protective clothing including hats  - FS 6 months, sooner if changes      - discussed and reviewed plan of care including, side effects of treatment(s) and/or medication(s) with patient. AVS provided to patient   - patient to call with any questions or concerns.

## 2021-01-04 NOTE — Unmapped (Signed)
Protecting yourself from the sun  -  Use sunscreen regularly on all sun exposed skin.  Don???t forget the ears and lips!   -  Recommended broad-spectrum sunscreen with an SPF 30-50 and re-applying every 1 1/2 hrs or after heavy sweating or swimming.  -  Wear wide brimmed hats and sun protective clothing when out in direct sunlight.  Swim shirts (aka rash guards) are a great idea and negates the need to reapply sunscreen in those areas.   ?? Seek the shade whenever possible especially between the hours of 10 am and 4 pm when the sun???s rays are the strongest.     Skin Health  Perform monthly home self skin exams  Take pictures of your back once/month for monitoring   Return to clinic if suspicious lesions are not healing in a 4-6 week time frame or noted changes in size, shape, or color of a lesion (monitoring moles for concerning signs- ABCDE, as described below)   Annual full skin cancer screening are recommended    ABCDEs of melanoma   A - Asymmetrical shape - One half of the lesion does not match the other half.  B - Border - The edges are ragged, notched, irregular, or blurred   C - Color - The color is not the same throughout or it has shades of tan, brown, black, red, white, or blue.   D - Diameter - increasing diameter   E - Evolving - lesions that are changing should be examined

## 2021-06-07 ENCOUNTER — Ambulatory Visit: Payer: PRIVATE HEALTH INSURANCE | Attending: Gerontology

## 2021-06-19 IMAGING — MR MRI CERVICAL SPINE WITHOUT CONTRAST
7 of 8 series · 36 of 48 positions shown · non-contrast
Comparison: none

﻿

MAGNETIC RESONANCE IMAGING CERVICAL SPINE WITHOUT INTRAVENOUS CONTRAST ADMINISTRATION
HISTORY: Cervical spondylosis without myelopathy.  Cervicalgia.  Claustrophobia.
TECHNIQUE: Multiplanar magnetic resonance imaging of the cervical spine was accomplished utilizing a surface coil and an open 0.3[employer]itesilver Savero scanner without intravenous contrast administration.  T1 and T2-weighted thin slice sections were obtained.

[Series 1: scano s/c · axial · 5.0mm · 1.02mm/px · z∈[-8,+130]mm · 4 of 14 slices shown]
[im 1/14]
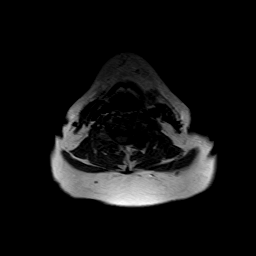
[im 5/14]
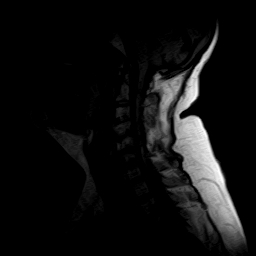
[im 9/14]
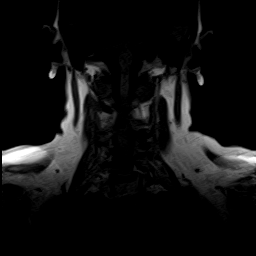
[im 14/14]
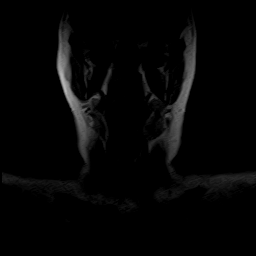

[Series 2: sag fir · sagittal · 3.0mm · 0.94mm/px · 5 of 15 slices shown]
[im 1/15]
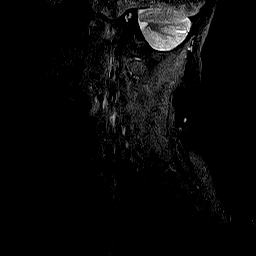
[im 4/15]
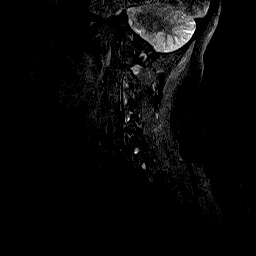
[im 8/15]
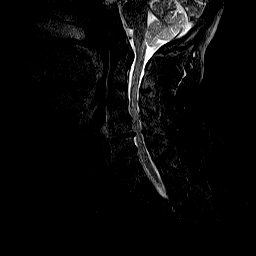
[im 11/15]
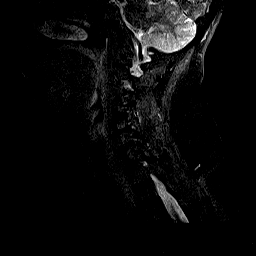
[im 15/15]
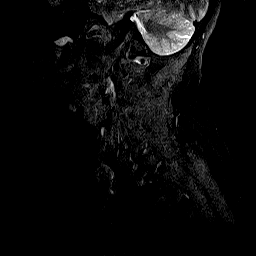

[Series 3: T1 · sagittal · 3.0mm · 0.94mm/px · 5 of 15 slices shown]
[im 1/15]
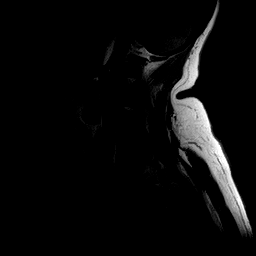
[im 4/15]
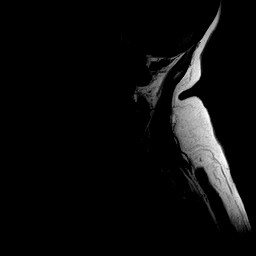
[im 8/15]
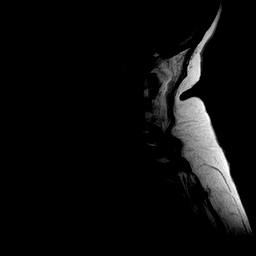
[im 11/15]
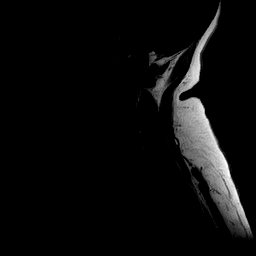
[im 15/15]
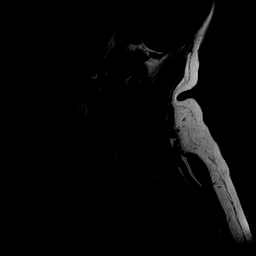

[Series 4: T2 · sagittal · 3.0mm · 0.94mm/px · 5 of 15 slices shown]
[im 1/15]
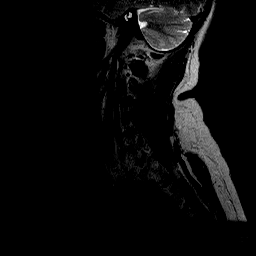
[im 4/15]
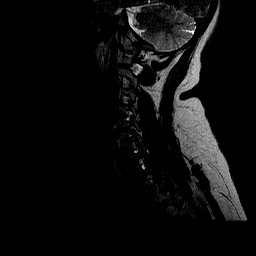
[im 8/15]
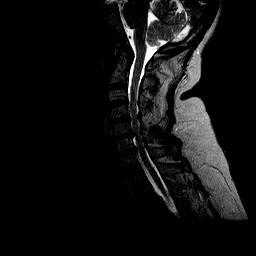
[im 11/15]
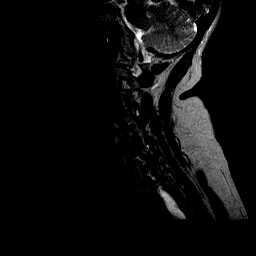
[im 15/15]
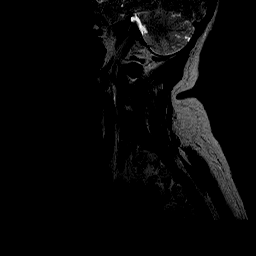

[Series 5: trs ge w/mtc · axial · 3.0mm · 1.02mm/px · z∈[-82,+17]mm · 8 of 26 slices shown]
[im 1/26]
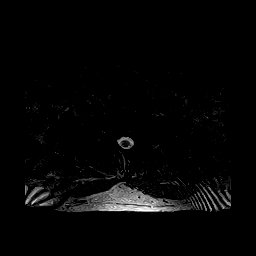
[im 4/26]
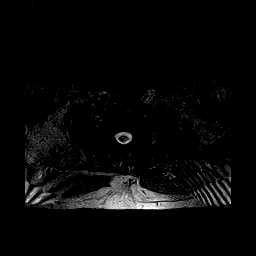
[im 8/26]
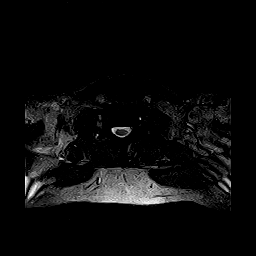
[im 11/26]
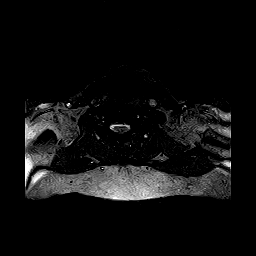
[im 15/26]
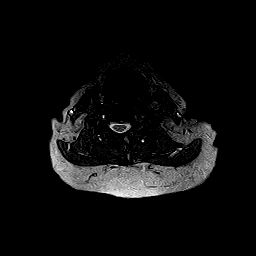
[im 18/26]
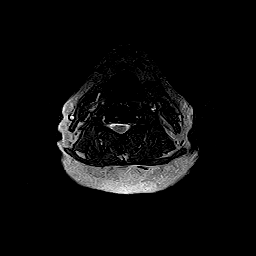
[im 22/26]
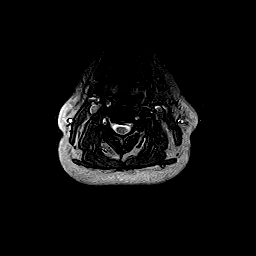
[im 26/26]
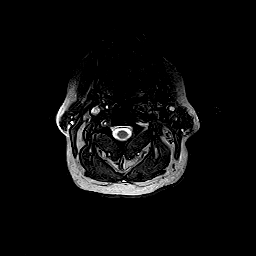

[Series 6: (id) · axial · 3.0mm · 1.02mm/px · z∈[-82,+17]mm · 8 of 26 slices shown]
[im 1/26]
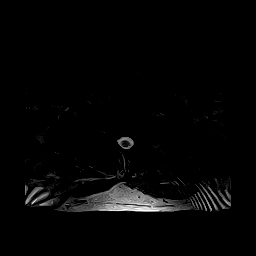
[im 4/26]
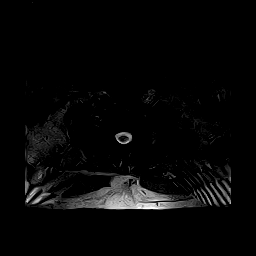
[im 8/26]
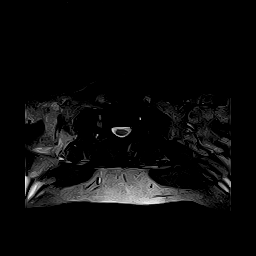
[im 11/26]
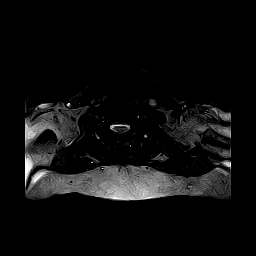
[im 15/26]
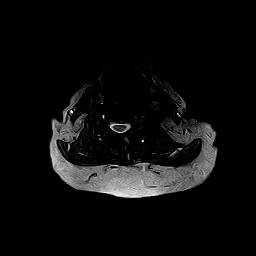
[im 18/26]
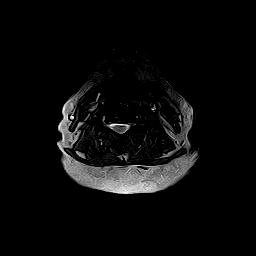
[im 22/26]
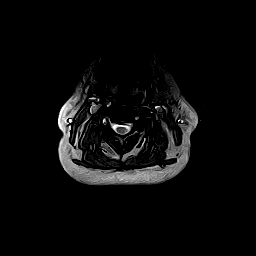
[im 26/26]
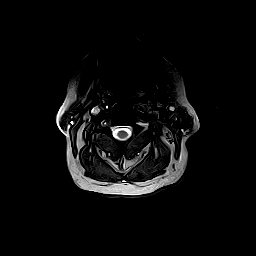

[Series 7: cor shim · coronal · 10.0mm · 4.69mm/px · 1 of 3 slices shown]
[im 1/3]
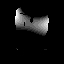

[36 of 48 positions shown; findings below may reference images not displayed]

FINDINGS: Comparison made to prior MRI cervical spine evaluation from [facility] dated 04/20/2020.  

A mild cervical dextroscoliosis is present.  Anterior to posterior alignment maintained.  The cervical vertebrae themselves appear normal in height.  There are no significant abnormalities of regional bone marrow signal.  New visualization of mild left maxillary sinus mucosal thickening/chronic sinusitis.  

The region of the foramen magnum appears normal.  No significant focal abnormalities of included paraspinal soft tissues.  

Moderately severe generalized cervical spondylosis and degenerative disc disease is again apparent with disc desiccation and disc space narrowing, significant extradural spurring, and facet joint sclerosis.  

Examination of the C5-C6 interspace again demonstrates prominent spondylosis with large ventral disc and spur complex, and tiny central disc protrusion with right-sided asymmetry.  Severe central and lateral cervical spinal canal stenosis is again observed with diffuse cord compression and attenuation.  AP diameter of spinal canal again estimated at approximately 4.5 mm.  Severe right greater than left bilateral C6 neural foraminal stenoses.  

Examination of the C4-C5 interspace demonstrates spondylosis with ventral disc and spur complex and small broad-based central disc protrusion.  Central and lateral cervical spinal canal stenosis again observed with ventral cord flattening.  AP diameter of spinal canal again estimated at 7 mm focally.  Moderate left greater than right bilateral C5 neural foraminal stenoses.  

Examination of the C6-C7 interspace demonstrates spondylosis with small to moderate sized ventral disc and spur complex.  Disc and spur appear to contact though not significantly compress ventral cord.  Moderately severe left and mild right C7 neural foraminal stenoses.  

Early spondylosis with disc and spur eccentric left C3-C4 interspace.  Moderately severe eccentric stenosis of the left C4 neural foramen.  No additional significant extradural abnormalities are demonstrated.  There is no evidence for a new focal nuclear herniation.  When allowance is made for patient motion, there is no clear evidence for cord enlargement or localized myelomalacia.
IMPRESSION: 1. Prominent cervical spondylosis and degenerative disc disease as previously described.  Persistent severe central and lateral cervical spinal canal stenosis, with cord compression, C5-C6 interspace.  More mild spinal canal stenosis C4-C5 greater than C6-C7 interspaces.  Bilateral neural foraminal stenoses as previously discussed. 

2. No evidence for a new focal nuclear herniation.

3. New partial visualization of chronic appearing left maxillary paranasal sinusitis.  

4. Please see in-depth discussion in body of report.

## 2021-07-03 ENCOUNTER — Ambulatory Visit: Admit: 2021-07-03 | Discharge: 2021-07-03 | Payer: PRIVATE HEALTH INSURANCE

## 2021-07-03 DIAGNOSIS — R1314 Dysphagia, pharyngoesophageal phase: Secondary | ICD-10-CM

## 2021-07-03 NOTE — Unmapped (Addendum)
Referring Provider: Orlie Pollen, DO  Date: 07/03/21    operating room/procedures    Recent imaging  01/23/21  Esophagus shows normal motility with no evidence of gross stricture or mass. Reviewed outside   No hiatus hernia was seen. No reflux is demonstrated during the evaluation.   Prominent cricopharyngeus is noted in the region of the upper esophageal sphincter.    Subjective   History of Present Illness Referring Provider: Orlie Pollen, DO  Patient is a 71 y.o. who presents for evaluation of dysphagia. Patient denies any difficulty tolerating PO diet. Her primary complaints is with swallowing saliva. In particular she struggles with initiation of swallow and endorses a weird sensation in her throat. She's had two esophageal dilations in the past. The most recent one being in December. She said the dilations improved her symptoms for a few weeks but then the symptoms reappeared. Denies changes to voice or dyspnea. Esophogram in 9/22 showed a Prominent cricopharyngeus is noted in the region of the upper esophageal sphincter. Former smoker.     Laryngoscopy/Stroboscopy findings: 07/03/2021 with Oliver Pila normal arytenoid mobility type 1 reinke edema irregular and aperiodic wave no pooling secretions vallecula or pyriform sinus      I performed all critical or key portions of the service and discussion of the case with the speech-language pathologist. I was present during the endoscopic examination to review diagnosis with patient and provide assessment and plan for treatment.    Assessment     71 y.o. 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 FL ESOPHAGRAM, INC SCOUT/DELAYED IMAGES   ??? FL MODIFIED BARIUM SWALLOW, INC SCOUT/DELAYED IMAGES w SPEECH Therapy    .    Patient Instructions   I recommend a swallowing test called a  modified barium swallow, it is done in radiology. This will be done with an esophagram as well to test the esophagus at the same time.  I recommend 2 sessions with my speech pathologist to help treat pharyngoesophageal dysphagia, cricopharyngeal muscle achalasia.   I will see you back in 2-3 months, but feel free to come back sooner with issues.     My scheduler office phone number is [phone], fax number [phone].  If you have not scheduled yet,  please feel free to call her directly.                   View : No data to display.                     View : No data to display.                Physical Exam:  Vitals  Blood pressure 142/90, pulse 78, resp. rate 16, height 5' 4 (1.626 m), weight (!) 270 lb (122.5 kg), SpO2 91 %.    Constitutional: she is oriented to person, place, and time. she  appears well-developed.     Voice: rough mild mod   HENT:   Right Ear: External ear normal.   Left Ear: External ear normal.   OC/OP: Mucosa: normal Teeth:full denture/enentulous   Eyes: Conjunctivae are normal. Pupils are equal, round, and reactive to light.   Neck:full range of motion Incision:absent  Pulmonary/Chest: Effort normal.   Musculoskeletal: Normal range of motion.  Neurological: she is alert and oriented to person, place, and time.   Skin: Skin is warm and dry.   Psychiatric: she has a normal mood and affect.        Cancer Staging   Cancer Staging   No matching staging information was found for the patient.    Histories  She has a past medical history of Basal cell carcinoma.    She has no past surgical history on file.    Her family history is not on file.    She reports that she has quit smoking. She has never used smokeless tobacco. She reports that she does not drink alcohol.    Allergies  Peanut oil and Statins-hmg-coa reductase inhibitors    Medications  Outpatient Encounter Medications as of 07/03/2021   Medication Sig Dispense Refill   ??? acetaminophen (TYLENOL) 500 MG tablet Take by mouth.     ???  amlodipine-benazepril (LOTREL) 10-20 mg per capsule Take by mouth.     ??? aspirin-calcium carbonate 81 mg-300 mg calcium(777 mg) Tab Take by mouth.     ??? benazepril (LOTENSIN) 20 MG tablet Take by mouth.     ??? calcium carbonate (OS-CAL) 500 mg calcium (1,250 mg) chewable tablet Chew by mouth.     ??? escitalopram oxalate (LEXAPRO) 20 MG tablet      ??? fluocinonide (LIDEX) 0.05 % cream Apply topically 2 times a day. To affected areas on legs. Do not use longer than 3 weeks at a time 60 g 2   ??? hydroCHLOROthiazide (HYDRODIURIL) 25 MG tablet      ??? hydrocortisone 2.5 % cream Apply topically to affected areas in groin twice a day for no longer than two weeks at a time. 28 g 1   ??? hydrOXYzine HCl (ATARAX) 25 MG tablet TAKE 1 TABLET (ORAL) EVERY 6 HOURS AS NEEDED FOR ITCHING FOR 5 DAYS MAY CAUSE DROWSINESS     ??? ketoconazole (NIZORAL) 2 % cream Apply topically 2 times a day. To affected area in groin PRN 60 g 5   ??? ketoconazole (NIZORAL) 2 % shampoo Wash hair with shampoo three times a week for 1 month then once a week thereafter. Lather and leave on for 5-10 minutes before rinsing. 120 mL 6   ??? levothyroxine (SYNTHROID, LEVOTHROID) 125 MCG tablet TAKE 1 TABLET BY ORAL ROUTE EVERY DAY     ??? mometasone (NASONEX) 50 mcg/actuation nasal spray Use into each nostril.     ??? rosuvastatin (CRESTOR) 20 MG tablet Take 1 tablet (20 mg total) by mouth daily.     ??? spironolactone (ALDACTONE) 100 MG tablet Take by mouth.     ??? torsemide (DEMADEX) 20 MG tablet Take by mouth.     ??? triamcinolone (KENALOG) 0.1 % cream Apply topically to affected areas on left thigh twice a day for no longer than two weeks at a time. 30 g 3     No facility-administered encounter medications on file as of 07/03/2021.        Review of Systems:  * See scanned Review of Systems sheet.    Lab Review:   Lab Results   Component Value Date    WBC 12.4 (H) 11/26/2016    HGB 13.1 11/26/2016    HCT 38.9 11/26/2016    MCV 90.3 11/26/2016    PLT 295 11/26/2016     CREATININE 1.3 (H) 11/26/2016    BUN 37 (H) 11/26/2016    NA 139 11/26/2016    K 4.1 11/26/2016  CL 95 (L) 11/26/2016    CO2 32 11/26/2016       Risks & Benefits:   Risks, benefits and treatment options discussed with patient.    Medical Decision Making:  The following items were considered in medical decision making:  Permanent chart problem/surgery list reviewed  Permanent chart chronic medication/allergy list reviewed  Permanent chart social/family history reviewed  Review/order radiology tests  Review/order other diagnostic or treatment interventions  Review old records  Independent review of laryngoscopy/stroboscopy    Time spent:>35 Minutes

## 2021-07-03 NOTE — Unmapped (Signed)
I recommend a swallowing test called a modified barium swallow, it is done in radiology. This will be done with an esophagram as well to test the esophagus at the same time.  I recommend 2 sessions with my speech pathologist to help treat pharyngoesophageal dysphagia, cricopharyngeal muscle achalasia.   I will see you back in 2-3 months, but feel free to come back sooner with issues.     My scheduler office phone number is [phone], fax number [phone].  If you have not scheduled yet,  please feel free to call her directly.

## 2021-07-03 NOTE — Unmapped (Signed)
SLP videostroboscopic evaluation    Date/Time: 07/03/2021 2:37 PM  Performed by: Oliver PilaAngela Leocadio Heal, CCC-SLP    Type:  Flexible  Primary Diagnosis:     Primary Diagnosis:  Pharyngoesophageal phase Secondary Diagnosis:     Inflammatory / Infectious:  LPRD    INDICATIONS: Need for detailed evaluation of the larynx, and reported or perceptual pharyngoesophageal phase.         PROCEDURE DESCRIPTION:  The patient was placed in the sitting position. Aerosilized afrin or neosynepherine combined with 4% lidocaine. The nasal cavities, nasopharynx, oropharynx, hypopharynx and larynx were all examined. Vocal cords were examined during respiration and phonation at multiple intensities and pitches. The mucosal wave was assessed with stroboscopy. The following findings were noted.        Video Stroboscopy Ratings:Mucosa Right Left   Vocal fold edge  edema  edema     Mucosal Wave Right Left   Medial-lateral amplitude  mildly decreased  mildly decreased   Superior-inferior amplitude  mildly decreased and moderately decreased  mildly decreased and moderately decreased      Phase symmetry:  Mildly irregular   Periodocity:  Irregular - high pitch and irregular - modal pitch   Mucosal Wave Comment:  Intermittent chasing wave for both modal and high pitch  Open phase for high pitches  Closure:     Glottic closure::  Touch closure and posterior gap    Movement:     Arytenoid movement:  Equal movement of arytenoids bilaterally    Vertical level of approximation:  L superior to R    Latero-medial compression:  Partial    Antero-posterior constriction:  Partial    Masses, Lesions, Pooling:     Interarytenoid tissue changes:  Moderate    Post-cricoid space:  Abnormal    Otopharyngeal wall:  Abnormal    Endolaryngeal mucus:  Thick and sticky    Masses, Lesions, Pooling Comments:  ? L-sided hypopharyngeal Mucocele           Images:

## 2021-07-04 ENCOUNTER — Ambulatory Visit: Payer: PRIVATE HEALTH INSURANCE | Attending: Gerontology

## 2021-07-17 ENCOUNTER — Encounter

## 2021-07-17 ENCOUNTER — Encounter: Admit: 2021-07-17 | Discharge: 2021-07-17 | Payer: PRIVATE HEALTH INSURANCE | Attending: Speech-Language Pathologist

## 2021-07-17 ENCOUNTER — Inpatient Hospital Stay: Admit: 2021-07-17 | Discharge: 2021-07-17 | Payer: PRIVATE HEALTH INSURANCE

## 2021-07-17 DIAGNOSIS — R1312 Dysphagia, oropharyngeal phase: Secondary | ICD-10-CM

## 2021-07-17 DIAGNOSIS — J392 Other diseases of pharynx: Secondary | ICD-10-CM

## 2021-07-17 MED ORDER — barium sulfate (VARIBAR NECTAR) suspension 60 mL
40 | Freq: Once | ORAL | Status: AC | PRN
Start: 2021-07-17 — End: 2021-07-17
  Administered 2021-07-17: 14:00:00 60 mL via ORAL

## 2021-07-17 MED ORDER — EZ-HD (barium) 98 % SusR 135 mL
98 | Freq: Once | ORAL | Status: AC | PRN
Start: 2021-07-17 — End: 2021-07-17
  Administered 2021-07-17: 14:00:00 75 mL via ORAL

## 2021-07-17 MED ORDER — EZ-GAS II (sod bicarb-citric ac-simeth) granules 1 packet
2.21-1.53 | Freq: Once | ORAL | Status: AC | PRN
Start: 2021-07-17 — End: 2021-07-17
  Administered 2021-07-17: 14:00:00 1 via ORAL

## 2021-07-17 MED ORDER — VARIBAR HONEY (barium) suspension 1 teaspoonful
40 | Freq: Once | ORAL | Status: AC | PRN
Start: 2021-07-17 — End: 2021-07-17
  Administered 2021-07-17: 14:00:00 1 [tsp_us] via ORAL

## 2021-07-17 MED ORDER — VARIBAR THIN (barium) Powd 12 teaspoonful
81 | Freq: Once | ORAL | Status: AC | PRN
Start: 2021-07-17 — End: 2021-07-17
  Administered 2021-07-17: 14:00:00 12 [tsp_us] via ORAL

## 2021-07-17 MED ORDER — VARI-BAR (barium) Pste 3 teaspoonful
40 | Freq: Once | ORAL | Status: AC | PRN
Start: 2021-07-17 — End: 2021-07-17
  Administered 2021-07-17: 14:00:00 3 [tsp_us] via ORAL

## 2021-07-17 MED FILL — VARIBAR HONEY 40 % (W/V), 29% (W/W) ORAL SUSPENSION: 40 40 % (w/v) 29% (w/w) | ORAL | Qty: 250

## 2021-07-17 MED FILL — VARIBAR THIN LIQUID 81 % (W/W) ORAL POWDER: 81 81 % (w/w) | ORAL | Qty: 60

## 2021-07-17 MED FILL — E-Z-HD BARIUM 98 % ORAL SUSPENSION: 98 98 % | ORAL | Qty: 135

## 2021-07-17 MED FILL — VARIBAR NECTAR 40 % (W/V), 30 % (W/W) ORAL SUSPENSION: 40 40 % (w/v) | ORAL | Qty: 240

## 2021-07-17 MED FILL — VARIBAR PUDDING 40 % (W/V), 30% (W/W) ORAL PASTE: 40 40 % (w/v), 30% (w/w) | ORAL | Qty: 230

## 2021-07-17 MED FILL — E-Z-GAS II 2.21 GRAM-1.53 GRAM/4 GRAM ORAL EFFERVESCENT GRANULES PACK: 2.21-1.53 2.21-1.53 gram/4 gram | ORAL | Qty: 1

## 2021-07-17 NOTE — Unmapped (Signed)
OUTPATIENT SPEECH LANGUAGE PATHOLOGY EVALUATION  Modified Barium Swallow  [facility]    Patient:  Cassidy Mcmillan DOB:  [birth year] MRN:  [id number]    Date: 07/17/2021 Onset Date: no date stated by pt; upon review of medical record, patient had esophagram 01/2021 Prior Therapy:  Receiving [facility]    Medical/Rehab Diagnosis:   1. Oropharyngeal dysphagia           Referring Physician: Dr. Lestine Mount     Insurance: Payor: [employer]IONS / Plan: Myra Rude MEDICARE / Product Type: *No Product type* /       PMH: Reviewed past medical history in the chart.   Past Medical History:   Diagnosis Date   ??? Basal cell carcinoma         Thank you for this referral and please feel free to contact the performing SLP with questions.  See below for contact information.    Summary:  Patient presents with oropharyngeal dysphagia secondary to the following deficits:   Delayed swallow initiation, reduced epiglottic inversion, and insufficient velopharyngeal closure (more significant with larger amounts of thinner liquids)    This resulted in:  An unprotected airway, putting the patient at a higher for aspiration and minimal movement of the velum resulting in no vp closure, therefore allowing minimal about of bolus to enter the inferior portion of the velopharyngeal space     The following aspects were WFL:  Oral phase, laryngeal elevation/excursion, laryngeal vestibule closure, BOT retraction, and the pharyngeal stripping wave       Recommendations:  Diet: regular/thin liquids   Medications: whole with thin liquid   Compensatory techniques recommended: n/a  Treatment recommendations: follow-up with SLP at [facility] and complete a laryngoscopy/FEES for additional visualization of the epiglottis   Consults: [facility]   Repeat instrumental recommended before diet upgrade: n/a    Current status:  Current Diet: regular/thin  liquids  Orientation: adequate for exam   Respiratory status: room air   Pain Scale: none  Dentition: dentures   PMH / related diagnoses: basal cell carcinoma   Reason for exam: Pt with c/o weird sensation in her throat and difficulty swallowing saliva  Current SLP intervention: receiving therapy at [facility]      Before the procedure, the patient was identified by name and DOB. Pt was seated in a regular chair. Pt was viewed in the lateral position. Pt was alert and tolerated the procedure well.         Boluses, in order of   presentation Thin by tsp (5mL) Thin by tsp (5mL) Thin by cup 20mL Thin by cup, 40 mL,  Consecutive swallows Nectar by tsp  5mL Nectar by cup (20mL) Nectar by cup 40 ML, cosecutive swallow Honey by tsp (5mL) Puree Cookie Nectar by tsp  (AP view) Puree   (AP)   Compensatory Strategy/ Effectiveness na na na na na na na na na na na na   Oral phase  Reduced lip closure  Reduced AP propulsion  Atypical mastication pattern  Piecemealing  Reduced bolus control  Reduced bolus formation  Oral residue   [facility] WFL WFL n/a n/a   Swallow triggered at level of:  V- valleculae  L- laryngeal surface of epiglottis  P- pyriform sinuses  A-airway  NONE- no visible initiation Tip of epiglottis  Tip of epiglottis  Tip of epiglottis P Tip of epiglottis Tip of epiglottis V Tip of epiglottis Tip of epiglottis Tip of epiglottis n/a n/a   Residue:  BOT-base of tongue  V- valleculae  PW- pharyngeal wall  P- pyriform sinus   D - diffuse  See rating scale below     none none none none none none none none none none n/a n/a   Penetration/Aspiration Scale Score 1 1 1 1 1 1 1 1 1 1  n/a n/a         Cervical Esophageal Phase    WFL      Education:   Education was completed to pt/caregiver re: the results, recommendations, and POC.  Patient response: stated understanding       The Yale Pharyngeal Residue Severity Rating Scale          Definitions for severity of vallecular residue   None  0% No residue   Trace 1-5% Trace coating of the mucosa   Mild 5-25% Epiglottic ligament visible   Moderate 25-50% Epiglottic Ligament covered   Severe >50% Filled to epiglottic rim              Definition for severity of pyriform sinus residue   None 0% No residue   Trace 1-5% Trace coating of the mucosa   Mild 5-25% Up wall to quarter full   Moderate 25-50% Up wall to half full   Severe >50% Filled to aryepiglottic fold            Pen Asp Scale Score     Pen Asp   Score            Description of Events  1.            Material does not enter airway  2.            Material enters the airway, remains above the vocal folds,                  and is ejected from the airway.  3.            Material enters the airway, remains above the vocal folds,                  and is not ejected from the airway.  4             Material enters the airway, contacts the vocal folds,                  and is ejected from the airway.  5.            Material enters the airway, contacts the vocal folds,                  and is not ejected from the airway.  6.            Material enters the airway, passes below the vocal folds,                  and is ejected into the larynx or out of the airway.  7.            Material enters the airway, passes below the vocal folds,                 and is not ejected from the trachea despite effort.  8.           Material enters the airway, passes  below the vocal folds,      and no effort is made to eject.    Video is archived in the PACS system                      Total Minutes: 25  Charge: 92611    POC Treatment Areas CPT Codes POC Assessment Areas CPT Codes   Speech, Language and VoiceTx Codes []  92507   Speech, Language and Voice Assessment Codes []  92521  []  92522  []  92523  []  92524  []  96105  []  92616  []  95621    Dysphagia Tx Codes [x]  92526   Dysphagia Assessment Codes []  92610  [x]  92611  []  92612   Cognition Tx Codes []  97129  []  97130   Cognition Assessment Codes []  96125   AAC Tx Codes []   92609   AAC Assessment Codes []  92607  []  30865   Group Tx Codes []  78469         Therapist Signature: Cassidy Mcmillan, BA, Graduate Student Clinician               I was present and available for the entire session and agree with all documentation.      Cassidy Mouse, MS, CCC-SLP [phone]   Date: 07/17/2021    Physician Certification:   I certify that the above patient is under my care and requires the above services.  These professional services are to be provided from an established plan, related to the diagnosis and reviewed by me every 10th visits or every 90 days, whichever occurs first.    Physician Comments/Revisions:    Physician Name (printed):   Dr. Lestine Mount  Physician Signature: ___________________________________

## 2021-07-23 ENCOUNTER — Ambulatory Visit: Admit: 2021-07-23 | Payer: PRIVATE HEALTH INSURANCE

## 2021-07-23 DIAGNOSIS — R1314 Dysphagia, pharyngoesophageal phase: Secondary | ICD-10-CM

## 2021-07-23 NOTE — Unmapped (Signed)
?POC Statement: Referring Provider Certification:   I certify that the below patient is under my care and requires the below services., These professional services are to be provided from an established plan, related to the diagnosis and reviewed by me every 90 days or when a change is made to the plan of care, whichever occurs first.  CPT CODE UNITS BILLED  TIME BILLED TODAY DATE OF POC START RE-CERTIFICATION DATE (every 90 days) DISCHARGE DATE REFERRING PROVIDER   [phone] (Treatment of swallowing dysfunction and/or oral function for feeding)     1   Time in: 1125  Time out: 1205  Total: 40 min 07/23/21   10/20/2021       Lestine Mount, MD     ???    [facility]  Department of Otolaryngology  [facility]   Professional Voice, Swallowing, Airway Program   Speech Pathology Note   Evaluation and Treatment      Pt Seen for Therapy- Session # 1         07/23/2021    12:00 PM   Laryngology Database Diagnoses   SLP Treating Diagnoses Pharyngoesophageal dysphagia       BACKGROUND HISTORY:    Diagnosis: Pharyngoesophageal dysphagia and cricopharyngeal muscle achalasia   9/22 esophagram: prominent CP bar  02/26/21: esophageal balloon dilation at [facility] (yielded some short-term benefit)  07/03/21 appt with Dr. Providence Lanius: continued difficulty only with swallowing saliva. Trouble with swallow initiation and feels like the saliva gets caught in her throat. No symptoms of dysphagia with foods or liquids.         PREVIOUS SLP EVALUATIONS:  07/17/21 MBS at [facility]: Delayed swallow initiation, reduced epiglottic inversion, and insufficient velopharyngeal closure (more significant with larger amounts of thinner liquids)This resulted in: An unprotected airway, putting the patient at a higher for aspiration and minimal movement of the velum resulting in no vp closure, therefore allowing minimal about of bolus to enter the inferior portion of the velopharyngeal space     PMH: Patient stated she has thyroid  issues     SUBJECTIVE:   Patient states that she has no s/s of dysphagia with foods or liquids. She eats a regular + thin diet with no avoidance of foods. C/o difficulty swallowing saliva. She notices difficulty initiating the swallow as well as saliva sticking in her throat (pointed to hyoid bone and digastrics). Denies odynophagia. Weight stable. Intermittently has a hoarse voice though is not concerned with her voice at this time.     weight:   Wt Readings from Last 3 Encounters:   07/03/21 (!) 270 lb (122.5 kg)   01/04/21 (!) 270 lb (122.5 kg)        OBJECTIVE:     Goals Session #1 (07/23/21)   Patient/caregiver will demonstrate comprehension of presence of dysphagia, risks associated with penetration/ aspiration, and need to adhere to management recommendations Discussed results of MBS. Patient does not have any s/s of dysphagia with food or liquid at this time.    Patient will complete exercises for improved bolus propulsion through the oropharynx- independently and accurately -Introduced laryngeal MFR techniques today to decrease suspected perilaryngeal muscle tension and improve bolus propulsion (antr/postr, digastrics, downward larynx hold). Told to complete each exercise 3x per day.  -Recommended pt count to 3 in her head before swallowing to help with timing of swallow initiation. Also recommended she perform an effortful swallow with saliva to promote bolus propulsion.  ASSESSMENT    71 y.o. 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 Discussed results of recent MBS with patient. She does not report any s/s of dysphagia with food or liquid at this time. Given her current symptoms, it is suspected that her complaints are more d/t ? muscle tension dysphagia. Introduced laryngeal MFR today with a focus on decreasing perilaryngeal muscle tension thus improving bolus propulsion.  She reported an easier time swallowing her saliva upon completion of manual therapy and noticed significant soreness/tension in her digastrics where she feels like the saliva is sticking. Also introduced timing techniques to help facilitate swallow initiation. Swallowing sessions will target improved swallow initiation and bolus propulsion to help alleviate current symptoms. However, if patient begins to notice s/s of dysphagia with food/liquid, disordered aspects of her oropharyngeal swallow found on her MBS will then be addressed. Prognosis is good. Follow-up on 07/31/21.          Learning Assessment: Patient was able to communicate with therapist and verbalize understanding of directions / instructions.     RECOMMENDATIONS:   Adhere to written HEP.   Follow-up on 07/31/21.     Diet Recommendations:  Solid Consistency: Regular  Liquid Consistency: Thin  Functional Oral Intake Scale (FOIS): Level 7 Total oral intake with no restrictions     Thank you,      Orland Mustard, MS, CF-SLP  Speech Pathology Clinical Fellow  Department of Otolaryngology   Head and Neck Surgery  [facility]  P: [phone]/ [phone]  A: [address] [facility], [address] [place],  [place], [place] [address]     Supervisor     Thank you,      Danne Baxter, M.A., CCC-SLP, BCS-S  Speech Pathologist   Board Certified in Swallowing & Swallowing Disorders  Department of Otolaryngology   Head and Neck Surgery  [facility]  P: [phone]/ [phone]  A: [address] [facility], [address] [place],  [place], [place] [address]  E: [email or web address]

## 2021-07-31 ENCOUNTER — Ambulatory Visit: Payer: PRIVATE HEALTH INSURANCE

## 2021-09-11 ENCOUNTER — Ambulatory Visit: Payer: PRIVATE HEALTH INSURANCE

## 2021-09-11 NOTE — Unmapped (Deleted)
This progress note was copied forward from the note written by Lestine Mount, MD from my last clinic/office note. I have reviewed and updated the history, physical exam, data, assessment and plan of the note so that it reflects the evaluation and management of the patient on 09/11/2021.     Interval UpDate: 09/11/21    operating room/procedures    Recent imaging  01/23/21  Esophagus shows normal motility with no evidence of gross stricture or mass. Reviewed outside   No hiatus hernia was seen. No reflux is demonstrated during the evaluation.   Prominent cricopharyngeus is noted in the region of the upper esophageal sphincter.    06/13/21 History of Present Illness Referring Provider: Orlie Pollen, DO  Patient is a 71 y.o. who presents for evaluation of dysphagia. Patient denies any difficulty tolerating PO diet. Her primary complaints is with swallowing saliva. In particular she struggles with initiation of swallow and endorses a weird sensation in her throat. She's had two esophageal dilations in the past. The most recent one being in December. She said the dilations improved her symptoms for a few weeks but then the symptoms reappeared. Denies changes to voice or dyspnea. Esophogram in 9/22 showed a Prominent cricopharyngeus is noted in the region of the upper esophageal sphincter. Former smoker.     Laryngoscopy/Stroboscopy findings: 09/11/2021 with Cassidy Mcmillan normal arytenoid mobility type 1 reinke edema irregular and aperiodic wave no pooling secretions vallecula or pyriform sinus      I performed all critical or key portions of the service and discussion of the case with the speech-language pathologist. I was present during the endoscopic examination to review diagnosis with patient and provide assessment and plan for treatment.    operating room/procedures    Recent imaging   07/17/21 modifed barium swallow study Delayed swallow initiation, reduced epiglottic inversion, and insufficient velopharyngeal  closure (more significant with larger amounts of thinner liquids)  07/17/21 barium esophagram Cricopharyngeal hypertrophy at C5-C6 with narrowing of the esophagus by approximately 50%. No other mucosal abnormality noted. ??    Assessment     71 y.o. 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   .    There are no Patient Instructions on file for this visit.             View : No data to display.                     View : No data to display.                Physical Exam:  Vitals  There were no vitals taken for this visit.    Constitutional: she is oriented to person, place, and time. she  appears well-developed.     Voice: rough mild mod   HENT:   Right Ear: External ear normal.   Left Ear: External ear normal.   OC/OP: Mucosa: normal Teeth:full denture/enentulous   Eyes: Conjunctivae are normal. Pupils are equal, round, and reactive to light.   Neck:full range of motion Incision:absent  Pulmonary/Chest: Effort normal.   Musculoskeletal: Normal range of motion.   Neurological: she is alert and oriented to person, place, and time.   Skin: Skin is warm and dry.   Psychiatric: she  has a normal mood and affect.        Cancer Staging   Cancer Staging   No matching staging information was found for the patient.    Histories  She has a past medical history of Basal cell carcinoma.    She has no past surgical history on file.    Her family history is not on file.    She reports that she has quit smoking. She has never used smokeless tobacco. She reports that she does not drink alcohol.    Allergies  Peanut oil and Statins-hmg-coa reductase inhibitors    Medications  Outpatient Encounter Medications as of 09/11/2021   Medication Sig Dispense Refill   ??? acetaminophen (TYLENOL) 500 MG tablet Take by mouth.     ??? amlodipine-benazepril  (LOTREL) 10-20 mg per capsule Take by mouth.     ??? aspirin-calcium carbonate 81 mg-300 mg calcium(777 mg) Tab Take by mouth.     ??? benazepril (LOTENSIN) 20 MG tablet Take by mouth.     ??? calcium carbonate (OS-CAL) 500 mg calcium (1,250 mg) chewable tablet Chew by mouth.     ??? escitalopram oxalate (LEXAPRO) 20 MG tablet      ??? fluocinonide (LIDEX) 0.05 % cream Apply topically 2 times a day. To affected areas on legs. Do not use longer than 3 weeks at a time 60 g 2   ??? hydroCHLOROthiazide (HYDRODIURIL) 25 MG tablet      ??? hydrocortisone 2.5 % cream Apply topically to affected areas in groin twice a day for no longer than two weeks at a time. 28 g 1   ??? hydrOXYzine HCl (ATARAX) 25 MG tablet TAKE 1 TABLET (ORAL) EVERY 6 HOURS AS NEEDED FOR ITCHING FOR 5 DAYS MAY CAUSE DROWSINESS     ??? ketoconazole (NIZORAL) 2 % cream Apply topically 2 times a day. To affected area in groin PRN 60 g 5   ??? ketoconazole (NIZORAL) 2 % shampoo Wash hair with shampoo three times a week for 1 month then once a week thereafter. Lather and leave on for 5-10 minutes before rinsing. 120 mL 6   ??? levothyroxine (SYNTHROID, LEVOTHROID) 125 MCG tablet TAKE 1 TABLET BY ORAL ROUTE EVERY DAY     ??? mometasone (NASONEX) 50 mcg/actuation nasal spray Use into each nostril.     ??? rosuvastatin (CRESTOR) 20 MG tablet Take 1 tablet (20 mg total) by mouth daily.     ??? spironolactone (ALDACTONE) 100 MG tablet Take by mouth.     ??? torsemide (DEMADEX) 20 MG tablet Take by mouth.     ??? triamcinolone (KENALOG) 0.1 % cream Apply topically to affected areas on left thigh twice a day for no longer than two weeks at a time. 30 g 3     No facility-administered encounter medications on file as of 09/11/2021.        Review of Systems:  * See scanned Review of Systems sheet.    Lab Review:   Lab Results   Component Value Date    WBC 12.4 (H) 11/26/2016    HGB 13.1 11/26/2016    HCT 38.9 11/26/2016    MCV 90.3 11/26/2016    PLT 295 11/26/2016    CREATININE 1.3 (H) 11/26/2016     BUN 37 (H) 11/26/2016    NA 139 11/26/2016    K 4.1 11/26/2016    CL 95 (L) 11/26/2016    CO2 32 11/26/2016       Risks & Benefits:  Risks, benefits and treatment options discussed with patient.    Medical Decision Making:  The following items were considered in medical decision making:  Permanent chart problem/surgery list reviewed  Permanent chart chronic medication/allergy list reviewed  Permanent chart social/family history reviewed  Review/order radiology tests  Review/order other diagnostic or treatment interventions  Review old records  Independent review of laryngoscopy/stroboscopy    Time spent:>35 Minutes

## 2022-01-23 ENCOUNTER — Ambulatory Visit: Admit: 2022-01-23 | Discharge: 2022-01-29 | Payer: Medicare (Managed Care) | Attending: Gerontology

## 2022-01-23 DIAGNOSIS — D1801 Hemangioma of skin and subcutaneous tissue: Secondary | ICD-10-CM

## 2022-01-23 MED ORDER — lidocaine 2%-EPINEPHrine 1:100,000 dental injection
2 | Freq: Once | INTRAMUSCULAR | Status: AC
Start: 2022-01-23 — End: 2022-01-23
  Administered 2022-01-23: 16:00:00 1.7 mL via SUBCUTANEOUS

## 2022-01-23 NOTE — Unmapped (Signed)
Dermatology Progress Note     Cassidy Mcmillan is a 71 y.o. White or Caucasian female, LV 01/2021    Chief Complaint   Patient presents with    Skin Lesion     Pt. Is here for FSE     HPI:     1. New asx lesions on left cheek x few weeks, no changes      Pruritic lesion on back, no changes    2. Asx lesion on lip x few months. Picked lesion and bled. No other changes     3. Asymptomatic brown lesions on body x years. No noted changes.       DERMATOLOGIC HISTORY:      -2022, nBCC right infraorbital cheek s/p Mohs per Dr Sherre Scarlet      -20+ years ago, upper back, uncertain type    Allergies   Allergen Reactions    Peanut Oil Hives    Statins-Hmg-Coa Reductase Inhibitors      Other reaction(s): Other (See Comments)  Joint pains      ADDITIONAL HISTORY:  (-)  family history of melanoma  (+)  personal history of NMSC or MM  (NMSCs as above)   (-)  sunburns easily  (+)  uses sunscreen  (-)  history of tanning bed use    PHYSICAL EXAM:    Examination was performed of the following: scalp/hair, head/face, conjunctivae/eyelids, gums/teeth/lips, neck, breast/axilla/chest, abdomen, back, RUE, LUE, RLE, LLE, nails/digits, & groin/buttocks    Abnormalities noted include: face, back     Appears well nourished, alert and oriented x 3    1. Left cheek, medial with 8mm pink scaly papule       Left cheek, lateral with 8mm pink smooth papule       Right lower back with 9mm erythematous telangiectatic smooth papule   2. Left lower lip with violaceous smooth papule   3. Face, trunk, and extremities with scattered uniformly colored brown macules and stuck on papules         ASSESSMENT AND PLAN:  1. Left cheek, medial........ R/O NMSC      Left cheek, lateral......... R/O BCC vs IDN       Right lower back........... R/O BCC  Consent/Time out: Risks of bleeding, scar, recurrence, nerve damage, infection discussed with pt prior to procedure and consent obtained. Proper patient, site, and procedure were also verified prior to procedure      Procedure Note:  - Tangential biopsy x3               - site cleansed with alcohol, anesthetized with 1% lidocaine with epinephrine, shave biopsy performed               - hemostasis obtained, vaseline ointment and bandage applied               - specimen(s) sent to dermatopath lab               - educated regarding wound care, bleeding, infection, scar    2. Angioma  - benign reassurance    3. SKs, lentigines   - benign reassurance   - discussed the many different presentations of these lesions and that they change occasionally  - advised that removal is an out of pocket cost and potential for recurrence      4. Hx of NMSC  - no signs of recurrence on exam today   - skin cancer education & ABCDE's reviewed  - educated regarding sun avoidance,  using sunscreen with SPF 30-50 and  sun-protective clothing including hats  - annual FSE, sooner if changes

## 2022-01-23 NOTE — Unmapped (Signed)
Left message on voicemail to call for bx results   If pt returns call, ok to discuss results and below treatment     1. 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   Treatment options: Efudex x 2-4 weeks vs LN2 (if opts for LN2, can treat at same time as EDC below)    2. Right lower back - Nodular basal cell carcinoma   Recommend EDC, please schedule (can use CL spot or double book with a new patient in the next 2 weeks)     3. Left cheek, lateral-  Inflamed seborrheic keratosis.   Benign, no further treatment

## 2022-01-29 NOTE — Unmapped (Signed)
Patient returned call for results. Informed based on message below.    Cassidy Barre, CNP  01/28/2022  5:55 PM EDT       Left message on voicemail to call for bx results  If pt returns call, ok to discuss results and below treatment     1. 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  Treatment options: Efudex x 2-4 weeks vs LN2 (if opts for LN2, can treat at same time as EDC below)     2. Right lower back - Nodular basal cell carcinoma  Recommend EDC, please schedule (can use CL spot or double book with a new patient in the next 2 weeks)     3. Left cheek, lateral-  Inflamed seborrheic keratosis.  Benign, no further treatment       Patient opted for LN2 on left cheek, medial cheek. Scheduled EDC and LN2 for 02/07/22.

## 2022-02-07 ENCOUNTER — Ambulatory Visit: Admit: 2022-02-07 | Discharge: 2022-02-07 | Payer: Medicare (Managed Care) | Attending: Gerontology

## 2022-02-07 DIAGNOSIS — C44519 Basal cell carcinoma of skin of other part of trunk: Secondary | ICD-10-CM

## 2022-02-07 NOTE — Unmapped (Signed)
Chief Complaint   Patient presents with    Procedure     Pt here for curettage     CASE: [id number]     #1.  CURETTAGE     Size of the lesion: 13 x 9 x 1 mm nBCC    Patient counseled on risks: pain, bleeding, scar, s/s of infection, & dyspigmentation  Informed consent obtained & time out performed    Procedure in Detail:   With the patient in position, the skin of the right lower back was scrubbed with alcohol. Anesthesia was obtained by injecting1% lidocaine with 100,000 epinephrine. Sharp dissection and removal of the tumor was performed with a curette. Hemostasis was obtained. The wound was dressed with petrolatum. Estimated blood loss was less than 1 mL. The patient tolerated the procedure well.      #2.   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 on left cheek, medial     Cryotherapy  - discussed risks & benefits of liquid nitrogen treatment, including but not limited to: scarring, postinflammatory pigment changes, blistering, pain, infection  - LN2 to 1 lesions on left cheek   - Pt advised to expect blistering and/or scabbing reaction & not to pick spots   - discussed post LN2 care with vaseline/aquaphor to area until healed     - advised on wound care with daily vaseline/aquaphor & bandage until healed (written instructions provided)  - skin cancer education & ABCDE's reviewed  - educated regarding sun protection strategies, including use of broad spectrum sunscreen with SPF 30-50, sun-protective clothing including broad brimmed hats, and sun avoidance during peak hours of the day   - FSE 6 months, sooner if changes occur

## 2022-02-07 NOTE — Unmapped (Signed)
Dermatology    You underwent an Electrodesiccation and Curettage procedure today for treatment of skin cancer     Care for your wound  After 24 hours, remove the bandage.  Clean the wound with mild soapy water & gently pat dry.  Apply Vaseline or Aqua Phor to the site using a Q-tip then cover with a clean dry bandage.  Repeat this process daily until the site is well healed.  If you experience any pain or discomfort, you can take Tylenol as needed    Please contact us at [phone] with any questions or concerns

## 2022-07-24 ENCOUNTER — Ambulatory Visit: Admit: 2022-07-24 | Discharge: 2022-07-24 | Payer: Medicare (Managed Care) | Attending: Gerontology

## 2022-07-24 DIAGNOSIS — L821 Other seborrheic keratosis: Secondary | ICD-10-CM

## 2022-07-24 MED ORDER — lidocaine 2%-EPINEPHrine 1:100,000 dental injection
2 | Freq: Once | INTRAMUSCULAR | Status: AC
Start: 2022-07-24 — End: 2022-07-24
  Administered 2022-07-24: 15:00:00 1.7 mL via SUBCUTANEOUS

## 2022-07-24 NOTE — Progress Notes (Signed)
Dermatology Progress Note     Cassidy Mcmillan is a 72 y.o. White or Caucasian female, LV 02/2022    Chief Complaint   Patient presents with    Skin Exam     fse     HPI:     1. New pruritic lesion on scalp, no tx     2. Irritated lesions on shoulder and under breast    3. Asymptomatic brown lesions on body x years. No noted changes.       DERMATOLOGIC HISTORY:     - 02/2022, HAK with focal SCCis left cheek, medial s/p LN2    - 02/2022, nBCC right lower back s/p EDC     - 2022, nBCC right infraorbital cheek s/p Mohs per Dr Stark Jock      - 20+ years ago, upper back, uncertain type    Allergies   Allergen Reactions    Peanut Hives    Peanut Oil Hives    Statins-Hmg-Coa Reductase Inhibitors      Other reaction(s): Other (See Comments)  Joint pains      ADDITIONAL HISTORY:  (-)  family history of melanoma  (+)  personal history of NMSC or MM  NMSCs as above)   (-)  sunburns easily  (+)  uses sunscreen  (-)  history of tanning bed use    PHYSICAL EXAM:    Examination was performed of the following: scalp/hair, head/face, conjunctivae/eyelids, gums/teeth/lips, neck, breast/axilla/chest, abdomen, back, RUE, LUE, RLE, LLE, nails/digits, & groin/buttocks    Abnormalities noted include: scalp, trunk     Appears well nourished, alert and oriented x 3    1. Left frontal scalp with well defined pink smooth papule   2. Right shoulder x4, left inframammary breast x1, right waist x1 with erythematous-tan, verrucous, well-defined papules  3. Face, trunk, and extremities with scattered uniformly colored brown macules and stuck on papules   4. Left cheek and back with smooth scars, no visible or palpable lesions        ASSESSMENT AND PLAN:  1. Left frontal scalp......... r/o BCC vs inflamed follicle   Consent/Time out: Risks of bleeding, scar, recurrence, nerve damage, infection discussed with pt prior to procedure and consent obtained. Proper patient, site, and procedure were also verified prior to procedure     Procedure Note:  -  Tangential biopsy               - site cleansed with alcohol, anesthetized with 1% lidocaine with epinephrine, shave biopsy performed               - hemostasis obtained, vaseline ointment and bandage applied               - specimen(s) sent to dermatopath lab               - educated regarding wound care, bleeding, infection, scar    2. ISKs  - benign reassurance, however given the irritating nature, LN2 indicated  - discussed LN2 risks, including scarring, postinflammatory pigment changes, blistering, pain, & infection.  - Pt opts for treatment     Cryotherapy procedure note:  - LN2 administered x 6 lesions  - discussed post LN2 care with vaseline/aquaphor daily until healed      3. SKs, lentigines   - benign reassurance   - discussed the many different presentations of these lesions and that they change occasionally  - advised that removal is an out of pocket cost and potential for recurrence  4. Hx of NMSCs  - no signs of recurrence on exam today   - skin cancer education & ABCDE's reviewed  - educated regarding sun avoidance, using sunscreen with SPF 30-50 and  sun-protective clothing including hats  - FSE 6 months, sooner if changes

## 2023-01-29 ENCOUNTER — Ambulatory Visit: Payer: Medicare (Managed Care) | Attending: Gerontology
